# Patient Record
Sex: Male | Born: 1950 | Race: Black or African American | Hispanic: No | Marital: Married | Smoking: Current some day smoker
Health system: Southern US, Community
[De-identification: ages and names within clinical notes are randomized; demographics above are authoritative.]

## PROBLEM LIST (undated history)

## (undated) DIAGNOSIS — C61 Malignant neoplasm of prostate: Secondary | ICD-10-CM

## (undated) DIAGNOSIS — N200 Calculus of kidney: Secondary | ICD-10-CM

## (undated) DIAGNOSIS — I10 Essential (primary) hypertension: Secondary | ICD-10-CM

## (undated) DIAGNOSIS — M1990 Unspecified osteoarthritis, unspecified site: Secondary | ICD-10-CM

## (undated) HISTORY — DX: Calculus of kidney: N20.0

## (undated) HISTORY — PX: OTHER SURGICAL HISTORY: SHX169

## (undated) HISTORY — DX: Essential (primary) hypertension: I10

---

## 2008-07-21 ENCOUNTER — Emergency Department: Payer: Self-pay | Admitting: Emergency Medicine

## 2010-08-21 ENCOUNTER — Emergency Department: Payer: Self-pay | Admitting: Emergency Medicine

## 2013-12-23 ENCOUNTER — Emergency Department: Payer: Self-pay | Admitting: Emergency Medicine

## 2013-12-23 LAB — CBC
HCT: 47.6 % (ref 40.0–52.0)
HGB: 16.1 g/dL (ref 13.0–18.0)
MCH: 31.2 pg (ref 26.0–34.0)
MCHC: 33.9 g/dL (ref 32.0–36.0)
MCV: 92 fL (ref 80–100)
Platelet: 156 10*3/uL (ref 150–440)
RBC: 5.17 10*6/uL (ref 4.40–5.90)
RDW: 13.6 % (ref 11.5–14.5)
WBC: 7.2 10*3/uL (ref 3.8–10.6)

## 2013-12-23 LAB — BASIC METABOLIC PANEL
Anion Gap: 7 (ref 7–16)
BUN: 13 mg/dL (ref 7–18)
Calcium, Total: 8.5 mg/dL (ref 8.5–10.1)
Chloride: 108 mmol/L — ABNORMAL HIGH (ref 98–107)
Co2: 23 mmol/L (ref 21–32)
Creatinine: 1 mg/dL (ref 0.60–1.30)
EGFR (African American): >60
EGFR (Non-African Amer.): >60
Glucose: 87 mg/dL (ref 65–99)
Osmolality: 275 (ref 275–301)
Potassium: 4.1 mmol/L (ref 3.5–5.1)
Sodium: 138 mmol/L (ref 136–145)

## 2013-12-23 LAB — TROPONIN I
Troponin-I: 0.03 ng/mL
Troponin-I: 0.03 ng/mL

## 2016-02-29 ENCOUNTER — Emergency Department
Admission: EM | Admit: 2016-02-29 | Discharge: 2016-02-29 | Disposition: A | Discharge status: Home / Self Care | Payer: Commercial Managed Care - HMO | Attending: Emergency Medicine | Admitting: Emergency Medicine

## 2016-02-29 ENCOUNTER — Encounter: Payer: Self-pay | Admitting: Emergency Medicine

## 2016-02-29 DIAGNOSIS — F1290 Cannabis use, unspecified, uncomplicated: Secondary | ICD-10-CM | POA: Diagnosis not present

## 2016-02-29 DIAGNOSIS — J029 Acute pharyngitis, unspecified: Secondary | ICD-10-CM | POA: Diagnosis not present

## 2016-02-29 DIAGNOSIS — R0982 Postnasal drip: Secondary | ICD-10-CM

## 2016-02-29 DIAGNOSIS — F17200 Nicotine dependence, unspecified, uncomplicated: Secondary | ICD-10-CM | POA: Diagnosis not present

## 2016-02-29 DIAGNOSIS — M1990 Unspecified osteoarthritis, unspecified site: Secondary | ICD-10-CM | POA: Insufficient documentation

## 2016-02-29 HISTORY — DX: Unspecified osteoarthritis, unspecified site: M19.90

## 2016-02-29 MED ORDER — MAGIC MOUTHWASH W/LIDOCAINE: 5.0000 mL | Freq: Four times a day (QID) | ORAL | Status: DC | PRN

## 2016-02-29 MED ORDER — LORATADINE 10 MG PO TABS: 10.0000 mg | ORAL_TABLET | Freq: Every day | ORAL | Status: DC

## 2016-02-29 NOTE — Discharge Instructions (Signed)
Sore Throat A sore throat is pain, burning, irritation, or scratchiness of the throat. There is often pain or tenderness when swallowing or talking. A sore throat may be accompanied by other symptoms, such as coughing, sneezing, fever, and swollen neck glands. A sore throat is often the first sign of another sickness, such as a cold, flu, strep throat, or mononucleosis (commonly known as mono). Most sore throats go away without medical treatment. CAUSES  The most common causes of a sore throat include:  A viral infection, such as a cold, flu, or mono.  A bacterial infection, such as strep throat, tonsillitis, or whooping cough.  Seasonal allergies.  Dryness in the air.  Irritants, such as smoke or pollution.  Gastroesophageal reflux disease (GERD). HOME CARE INSTRUCTIONS   Only take over-the-counter medicines as directed by your caregiver.  Drink enough fluids to keep your urine clear or pale yellow.  Rest as needed.  Try using throat sprays, lozenges, or sucking on hard candy to ease any pain (if older than 4 years or as directed).  Sip warm liquids, such as broth, herbal tea, or warm water with honey to relieve pain temporarily. You may also eat or drink cold or frozen liquids such as frozen ice pops.  Gargle with salt water (mix 1 tsp salt with 8 oz of water).  Do not smoke and avoid secondhand smoke.  Put a cool-mist humidifier in your bedroom at night to moisten the air. You can also turn on a hot shower and sit in the bathroom with the door closed for 5-10 minutes. SEEK IMMEDIATE MEDICAL CARE IF:  You have difficulty breathing.  You are unable to swallow fluids, soft foods, or your saliva.  You have increased swelling in the throat.  Your sore throat does not get better in 7 days.  You have nausea and vomiting.  You have a fever or persistent symptoms for more than 2-3 days.  You have a fever and your symptoms suddenly get worse. MAKE SURE YOU:   Understand  these instructions.  Will watch your condition.  Will get help right away if you are not doing well or get worse.   This information is not intended to replace advice given to you by your health care provider. Make sure you discuss any questions you have with your health care provider.   Document Released: 10/25/2004 Document Revised: 10/08/2014 Document Reviewed: 05/25/2012 Elsevier Interactive Patient Education 2016 [employer]  Viral Infections A viral infection can be caused by different types of viruses.Most viral infections are not serious and resolve on their own. However, some infections may cause severe symptoms and may lead to further complications. SYMPTOMS Viruses can frequently cause:  Minor sore throat.  Aches and pains.  Headaches.  Runny nose.  Different types of rashes.  Watery eyes.  Tiredness.  Cough.  Loss of appetite.  Gastrointestinal infections, resulting in nausea, vomiting, and diarrhea. These symptoms do not respond to antibiotics because the infection is not caused by bacteria. However, you might catch a bacterial infection following the viral infection. This is sometimes called a "superinfection." Symptoms of such a bacterial infection may include:  Worsening sore throat with pus and difficulty swallowing.  Swollen neck glands.  Chills and a high or persistent fever.  Severe headache.  Tenderness over the sinuses.  Persistent overall ill feeling (malaise), muscle aches, and tiredness (fatigue).  Persistent cough.  Yellow, green, or brown mucus production with coughing. HOME CARE INSTRUCTIONS   Only take over-the-counter or  prescription medicines for pain, discomfort, diarrhea, or fever as directed by your caregiver.  Drink enough water and fluids to keep your urine clear or pale yellow. Sports drinks can provide valuable electrolytes, sugars, and hydration.  Get plenty of rest and maintain proper nutrition. Soups and broths  with crackers or rice are fine. SEEK IMMEDIATE MEDICAL CARE IF:   You have severe headaches, shortness of breath, chest pain, neck pain, or an unusual rash.  You have uncontrolled vomiting, diarrhea, or you are unable to keep down fluids.  You or your child has an oral temperature above 102 F (38.9 C), not controlled by medicine.  Your baby is older than 3 months with a rectal temperature of 102 F (38.9 C) or higher.  Your baby is 67 months old or younger with a rectal temperature of 100.4 F (38 C) or higher. MAKE SURE YOU:   Understand these instructions.  Will watch your condition.  Will get help right away if you are not doing well or get worse.   This information is not intended to replace advice given to you by your health care provider. Make sure you discuss any questions you have with your health care provider.   Document Released: 06/27/2005 Document Revised: 12/10/2011 Document Reviewed: 02/23/2015 Elsevier Interactive Patient Education [employer].

## 2016-02-29 NOTE — ED Provider Notes (Signed)
[facility] Emergency Department Provider Note  ____________________________________________  Time seen: Approximately 7:55 AM  I have reviewed the triage vital signs and the nursing notes.   HISTORY  Chief Complaint Sore Throat    HPI Alexander Cooke is a 65 y.o. male , NAD, presents to the emergency department with one-day history of right-sided sore throat. Denies any injury or trauma to the face, neck, throat. States it hurts to swallow. Took an anti-inflammatory last night which seems to help some but did not take anything today. Denies any sick contacts. Has had mild nasal congestion but nothing that he has thought has been significant. Denies any sinus pressure, ear pain, headaches, nausea, vomiting, abdominal pain, rash.  Denies any fevers, chills, body aches. No difficulty swallowing nor difficulty breathing.   Past Medical History  Diagnosis Date  . Arthritis     There are no active problems to display for this patient.   History reviewed. No pertinent past surgical history.  Current Outpatient Rx  Name  Route  Sig  Dispense  Refill  . loratadine (CLARITIN) 10 MG tablet   Oral   Take 1 tablet (10 mg total) by mouth daily.   30 tablet   0   . magic mouthwash w/lidocaine SOLN   Oral   Take 5 mLs by mouth 4 (four) times daily as needed for mouth pain.   240 mL   0     Please mix 63mL diphenhydramine, 71mL nystatin, 80 ...     Allergies Review of patient's allergies indicates no known allergies.  No family history on file.  Social History Social History  Substance Use Topics  . Smoking status: Current Every Day Smoker  . Smokeless tobacco: None  . Alcohol Use: Yes     Review of Systems  Constitutional: No fever/chills, fatigue Eyes: No visual changes. No discharge, redness, swelling ENT: Positive sore throat, nasal congestion. No sinus pressure, ear pain, sneezing, runny nose. Cardiovascular: No chest pain. Respiratory: No  cough, chest congestion. No shortness of breath. No wheezing.  Gastrointestinal: No abdominal pain.  No nausea, vomiting.  No diarrhea.  Musculoskeletal: Negative for neck pain.  Skin: Negative for rash. Neurological: Negative for headaches, focal weakness or numbness. 10-point ROS otherwise negative.  ____________________________________________   PHYSICAL EXAM:  VITAL SIGNS: ED Triage Vitals  Enc Vitals Group     BP 02/29/16 0733 135/74 mmHg     Pulse Rate 02/29/16 0733 93     Resp 02/29/16 0733 18     Temp 02/29/16 0733 99.8 F (37.7 C)     Temp src --      SpO2 02/29/16 0733 94 %     Weight 02/29/16 0733 216 lb (97.977 kg)     Height 02/29/16 0733 5\' 7"  (1.702 m)     Head Cir --      Peak Flow --      Pain Score 02/29/16 0734 9     Pain Loc --      Pain Edu? --      Excl. in [place]? --      Constitutional: Alert and oriented. Well appearing and in no acute distress. Eyes: Conjunctivae are normal. PERRL. EOMI without pain.  Head: Atraumatic. ENT:      Ears: TMs visualized bilaterally without erythema, effusion, bulging, perforation.      Nose: No congestion/rhinnorhea.      Mouth/Throat: Mild injection of the pharynx without exudate or swelling. Uvula is midline. Mucous membranes are moist.  Clear postnasal drip noted. Neck: No stridor. No cervical spine tenderness to palpation. Supple with full range of motion. Hematological/Lymphatic/Immunilogical: No cervical lymphadenopathy but tenderness to palpation about the right, anterior cervical chain. Cardiovascular: Normal rate, regular rhythm. Normal S1 and S2.  Good peripheral circulation. Respiratory: Normal respiratory effort without tachypnea or retractions. Lungs CTAB with breath sounds noted in all lung fields. Neurologic:  Normal speech and language. No gross focal neurologic deficits are appreciated.  Skin:  Skin is warm, dry and intact. No rash noted. Psychiatric: Mood and affect are normal. Speech and behavior are  normal. Patient exhibits appropriate insight and judgement.   ____________________________________________   LABS (all labs ordered are listed, but only abnormal results are displayed)  Labs Reviewed  CULTURE, GROUP A STREP [facility])   ____________________________________________  EKG  None ____________________________________________  RADIOLOGY  None ____________________________________________    PROCEDURES  Procedure(s) performed: None    Medications - No data to display   ____________________________________________   INITIAL IMPRESSION / ASSESSMENT AND PLAN / ED COURSE  Pertinent lab results that were available during my care of the patient were reviewed by me and considered in my medical decision making (see chart for details). Group A strep culture was ordered and results will be called to the patient when available.  Patient's diagnosis is consistent with acute pharyngitis with post nasal drainage. Discussed with patient that symptoms are consistent with viral infection. Discussed we are completing throat culture will call him with results when available and treated further as necessary. Patient will be discharged home with prescriptions for loratadine and Magic mouthwash with lidocaine to use as directed. Advised patient to alternate Tylenol and ibuprofen as needed for throat pain. Patient is to follow up with [facility] if symptoms persist past this treatment course. Patient is given strict ED precautions to return to the ED for any worsening or new symptoms.     ____________________________________________  FINAL CLINICAL IMPRESSION(S) / ED DIAGNOSES  Final diagnoses:  Acute pharyngitis, unspecified etiology  Post-nasal drainage      NEW MEDICATIONS STARTED DURING THIS VISIT:  Discharge Medication List as of 02/29/2016  8:16 AM    START taking these medications   Details  loratadine (CLARITIN) 10 MG tablet Take 1 tablet (10 mg total) by  mouth daily., Starting 02/29/2016, Until Discontinued, Print    magic mouthwash w/lidocaine SOLN Take 5 mLs by mouth 4 (four) times daily as needed for mouth pain., Starting 02/29/2016, Until Discontinued, Print             Judithe Modest Larke, PA-C 02/29/16 [id number]  Delman Kitten, MD 02/29/16 [phone]

## 2016-02-29 NOTE — ED Notes (Signed)
Pt presents to ED with reports of sore throat mainly on the right side. Pt states has had a little nasal congestion. Pt speaking in complete sentences and in no apparent distress in triage.

## 2016-03-02 LAB — CULTURE, GROUP A STREP (THRC)

## 2016-03-23 DIAGNOSIS — Z125 Encounter for screening for malignant neoplasm of prostate: Secondary | ICD-10-CM | POA: Diagnosis not present

## 2016-03-23 DIAGNOSIS — G5603 Carpal tunnel syndrome, bilateral upper limbs: Secondary | ICD-10-CM | POA: Diagnosis not present

## 2016-03-23 DIAGNOSIS — I10 Essential (primary) hypertension: Secondary | ICD-10-CM | POA: Diagnosis not present

## 2016-03-23 DIAGNOSIS — Z0001 Encounter for general adult medical examination with abnormal findings: Secondary | ICD-10-CM | POA: Diagnosis not present

## 2016-03-23 DIAGNOSIS — R030 Elevated blood-pressure reading, without diagnosis of hypertension: Secondary | ICD-10-CM | POA: Diagnosis not present

## 2016-03-23 DIAGNOSIS — F17210 Nicotine dependence, cigarettes, uncomplicated: Secondary | ICD-10-CM | POA: Diagnosis not present

## 2016-04-23 DIAGNOSIS — E781 Pure hyperglyceridemia: Secondary | ICD-10-CM | POA: Diagnosis not present

## 2016-04-23 DIAGNOSIS — G5603 Carpal tunnel syndrome, bilateral upper limbs: Secondary | ICD-10-CM | POA: Diagnosis not present

## 2016-04-23 DIAGNOSIS — Z125 Encounter for screening for malignant neoplasm of prostate: Secondary | ICD-10-CM | POA: Diagnosis not present

## 2016-04-23 DIAGNOSIS — F17210 Nicotine dependence, cigarettes, uncomplicated: Secondary | ICD-10-CM | POA: Diagnosis not present

## 2016-04-23 DIAGNOSIS — S39012A Strain of muscle, fascia and tendon of lower back, initial encounter: Secondary | ICD-10-CM | POA: Diagnosis not present

## 2016-04-23 DIAGNOSIS — I10 Essential (primary) hypertension: Secondary | ICD-10-CM | POA: Diagnosis not present

## 2016-04-23 DIAGNOSIS — Z0001 Encounter for general adult medical examination with abnormal findings: Secondary | ICD-10-CM | POA: Diagnosis not present

## 2016-07-27 DIAGNOSIS — S81811A Laceration without foreign body, right lower leg, initial encounter: Secondary | ICD-10-CM | POA: Diagnosis not present

## 2017-01-31 ENCOUNTER — Other Ambulatory Visit: Payer: Self-pay | Admitting: Nurse Practitioner

## 2017-01-31 ENCOUNTER — Ambulatory Visit
Admission: RE | Admit: 2017-01-31 | Discharge: 2017-01-31 | Disposition: A | Discharge status: Home / Self Care | Payer: Medicare HMO | Source: Ambulatory Visit | Attending: Nurse Practitioner | Admitting: Nurse Practitioner

## 2017-01-31 DIAGNOSIS — M064 Inflammatory polyarthropathy: Secondary | ICD-10-CM | POA: Diagnosis not present

## 2017-01-31 DIAGNOSIS — M1712 Unilateral primary osteoarthritis, left knee: Secondary | ICD-10-CM | POA: Diagnosis not present

## 2017-01-31 DIAGNOSIS — R52 Pain, unspecified: Secondary | ICD-10-CM

## 2017-01-31 DIAGNOSIS — M25462 Effusion, left knee: Secondary | ICD-10-CM | POA: Diagnosis not present

## 2017-01-31 DIAGNOSIS — F17210 Nicotine dependence, cigarettes, uncomplicated: Secondary | ICD-10-CM | POA: Diagnosis not present

## 2017-01-31 DIAGNOSIS — M189 Osteoarthritis of first carpometacarpal joint, unspecified: Secondary | ICD-10-CM | POA: Diagnosis not present

## 2017-01-31 DIAGNOSIS — M25562 Pain in left knee: Secondary | ICD-10-CM | POA: Diagnosis not present

## 2017-01-31 DIAGNOSIS — I10 Essential (primary) hypertension: Secondary | ICD-10-CM | POA: Diagnosis not present

## 2017-02-13 DIAGNOSIS — M238X2 Other internal derangements of left knee: Secondary | ICD-10-CM | POA: Diagnosis not present

## 2017-06-18 DIAGNOSIS — M109 Gout, unspecified: Secondary | ICD-10-CM | POA: Diagnosis not present

## 2017-06-18 DIAGNOSIS — E781 Pure hyperglyceridemia: Secondary | ICD-10-CM | POA: Diagnosis not present

## 2017-06-18 DIAGNOSIS — I10 Essential (primary) hypertension: Secondary | ICD-10-CM | POA: Diagnosis not present

## 2017-06-18 DIAGNOSIS — G5603 Carpal tunnel syndrome, bilateral upper limbs: Secondary | ICD-10-CM | POA: Diagnosis not present

## 2017-06-18 DIAGNOSIS — Z0001 Encounter for general adult medical examination with abnormal findings: Secondary | ICD-10-CM | POA: Diagnosis not present

## 2017-06-18 DIAGNOSIS — F17210 Nicotine dependence, cigarettes, uncomplicated: Secondary | ICD-10-CM | POA: Diagnosis not present

## 2017-06-20 DIAGNOSIS — E538 Deficiency of other specified B group vitamins: Secondary | ICD-10-CM | POA: Diagnosis not present

## 2017-06-20 DIAGNOSIS — E079 Disorder of thyroid, unspecified: Secondary | ICD-10-CM | POA: Diagnosis not present

## 2017-06-20 DIAGNOSIS — Z125 Encounter for screening for malignant neoplasm of prostate: Secondary | ICD-10-CM | POA: Diagnosis not present

## 2017-06-20 DIAGNOSIS — Z0001 Encounter for general adult medical examination with abnormal findings: Secondary | ICD-10-CM | POA: Diagnosis not present

## 2017-06-20 DIAGNOSIS — E0781 Sick-euthyroid syndrome: Secondary | ICD-10-CM | POA: Diagnosis not present

## 2017-06-20 DIAGNOSIS — R972 Elevated prostate specific antigen [PSA]: Secondary | ICD-10-CM | POA: Diagnosis not present

## 2017-06-20 DIAGNOSIS — I10 Essential (primary) hypertension: Secondary | ICD-10-CM | POA: Diagnosis not present

## 2017-06-20 DIAGNOSIS — E781 Pure hyperglyceridemia: Secondary | ICD-10-CM | POA: Diagnosis not present

## 2017-07-18 DIAGNOSIS — R944 Abnormal results of kidney function studies: Secondary | ICD-10-CM | POA: Diagnosis not present

## 2017-07-18 DIAGNOSIS — R972 Elevated prostate specific antigen [PSA]: Secondary | ICD-10-CM | POA: Diagnosis not present

## 2017-07-18 DIAGNOSIS — I10 Essential (primary) hypertension: Secondary | ICD-10-CM | POA: Diagnosis not present

## 2017-07-22 DIAGNOSIS — R944 Abnormal results of kidney function studies: Secondary | ICD-10-CM | POA: Diagnosis not present

## 2017-08-08 ENCOUNTER — Other Ambulatory Visit: Payer: Self-pay | Admitting: Internal Medicine

## 2017-08-08 DIAGNOSIS — R944 Abnormal results of kidney function studies: Secondary | ICD-10-CM

## 2017-08-14 ENCOUNTER — Ambulatory Visit: Payer: Medicare HMO

## 2017-08-15 ENCOUNTER — Ambulatory Visit: Payer: Medicare HMO

## 2018-02-26 ENCOUNTER — Other Ambulatory Visit: Payer: Self-pay

## 2018-04-01 IMAGING — CR DG KNEE COMPLETE 4+V*L*
1 series · 4 of 4 positions shown · non-contrast
Comparison: None.

CLINICAL DATA: Knee pain.

EXAM:
LEFT KNEE - COMPLETE 4+ VIEW

[Series 1: dg knee 4 v w/ sunrise/patella left · 0.14mm/px · 4 of 4 slices shown]
[im 1/4]
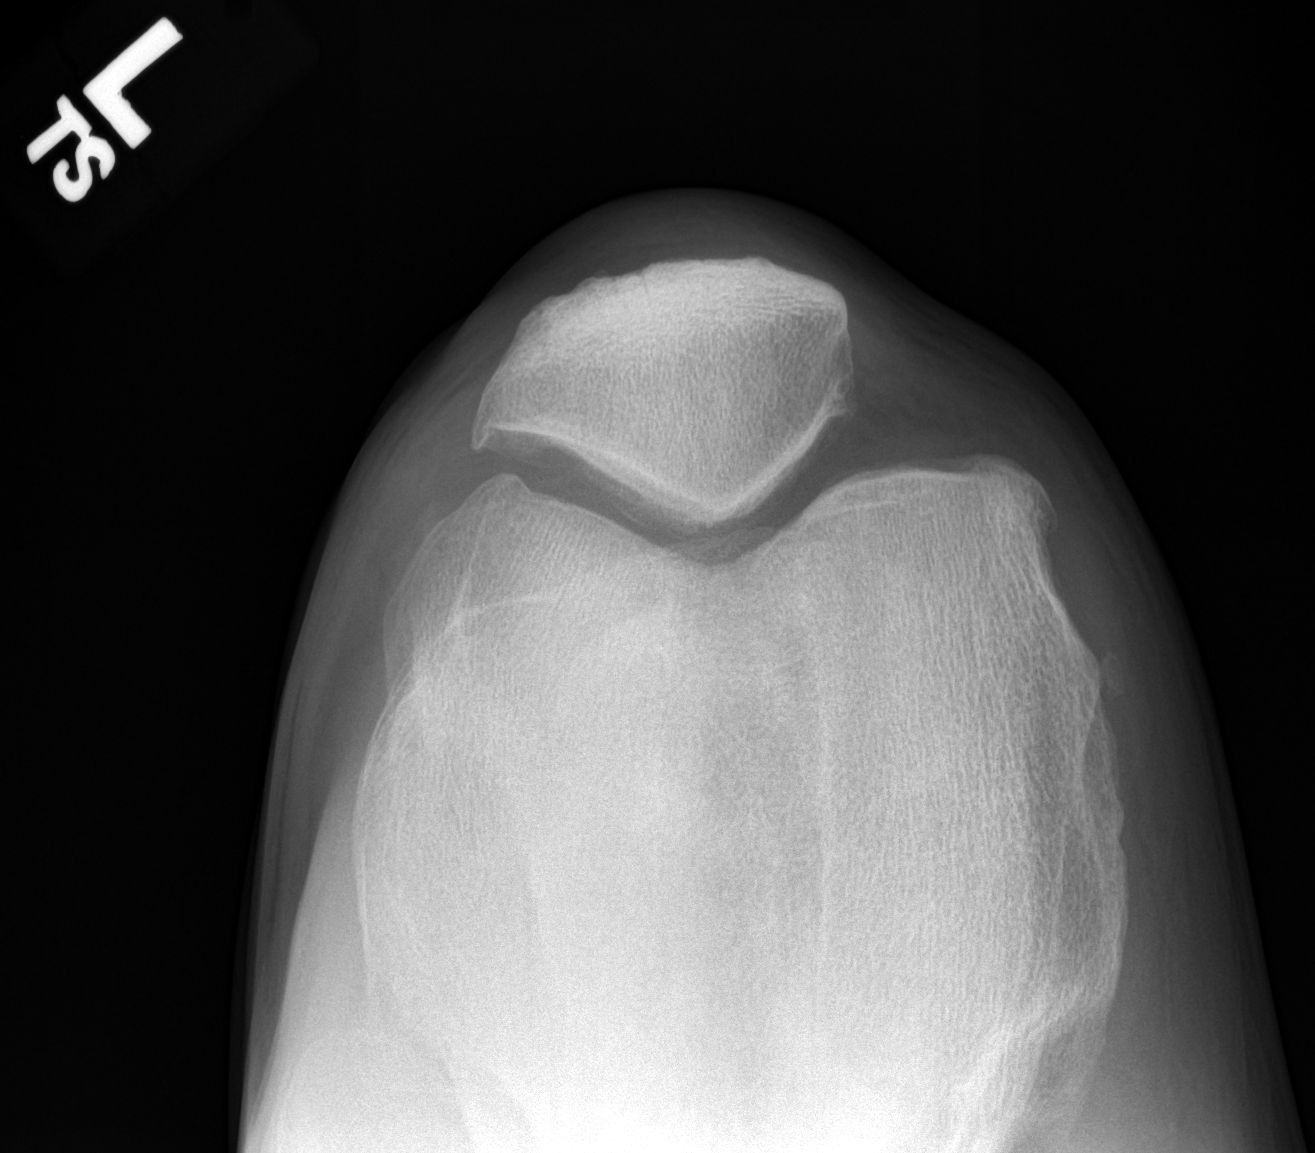
[im 2/4]
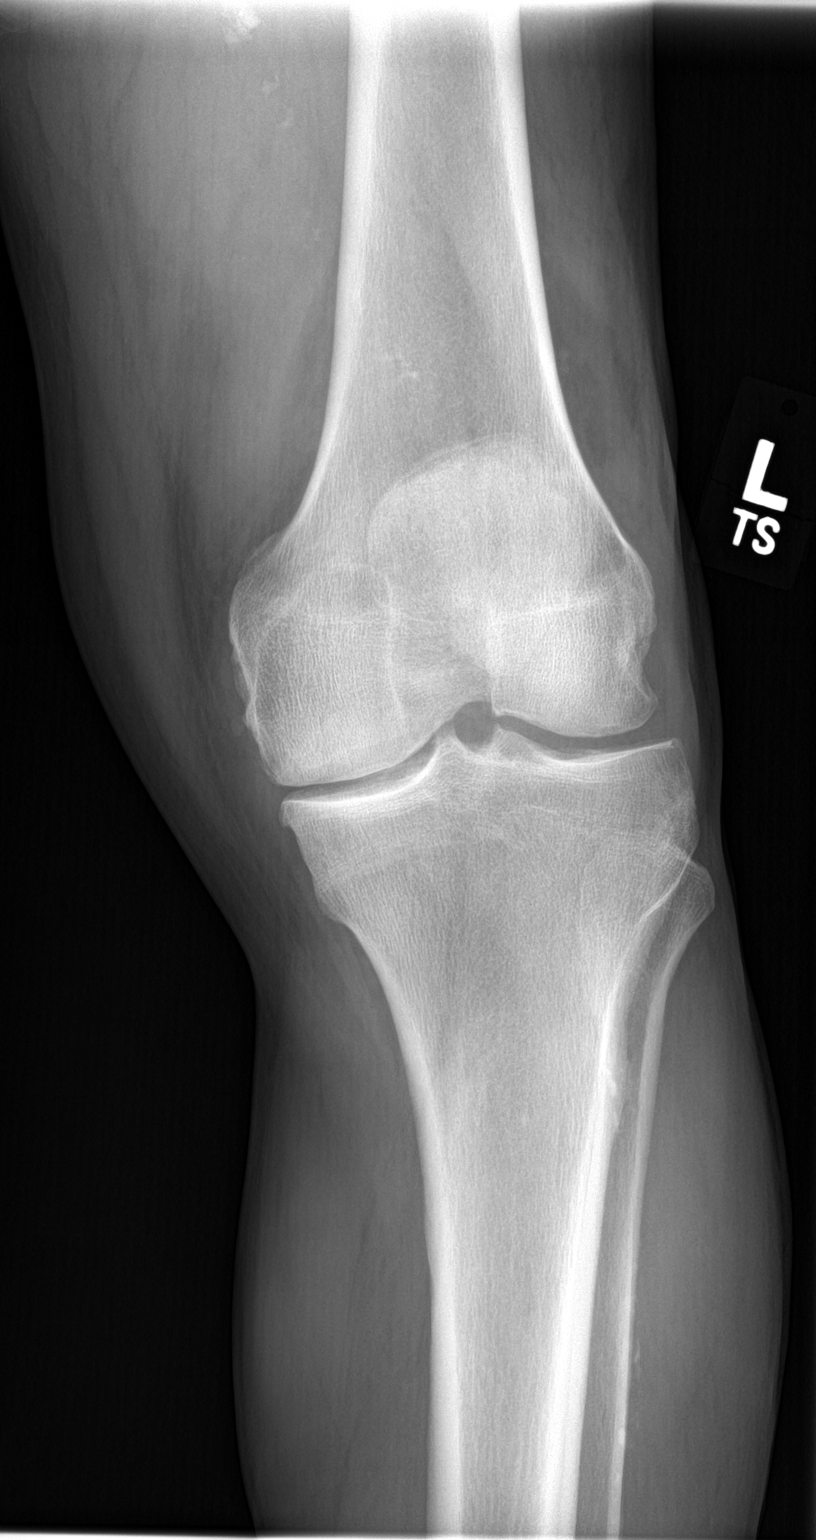
[im 3/4]
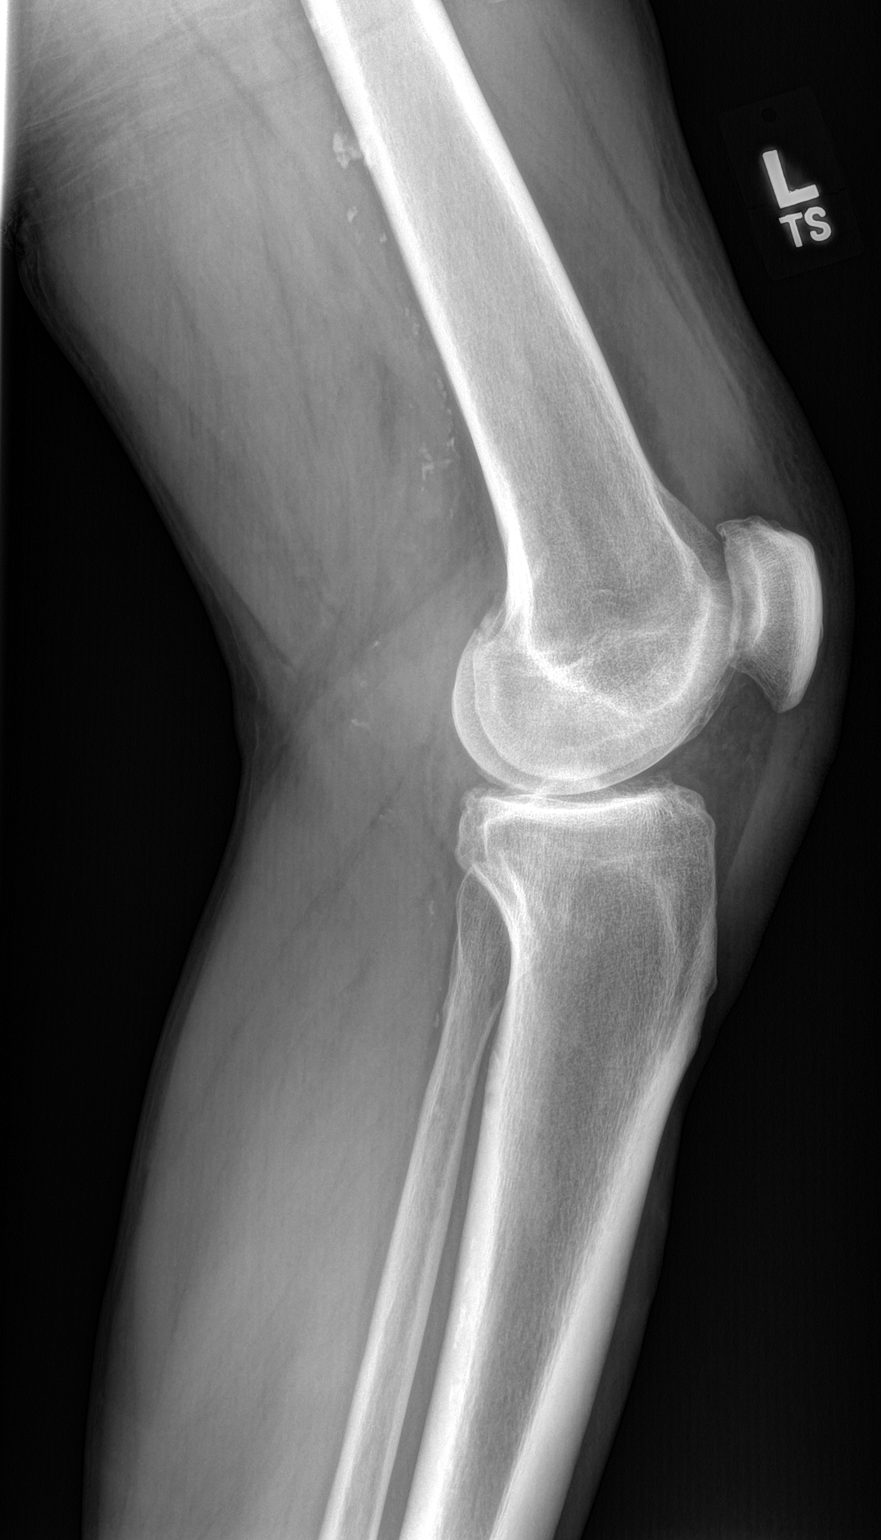
[im 4/4]
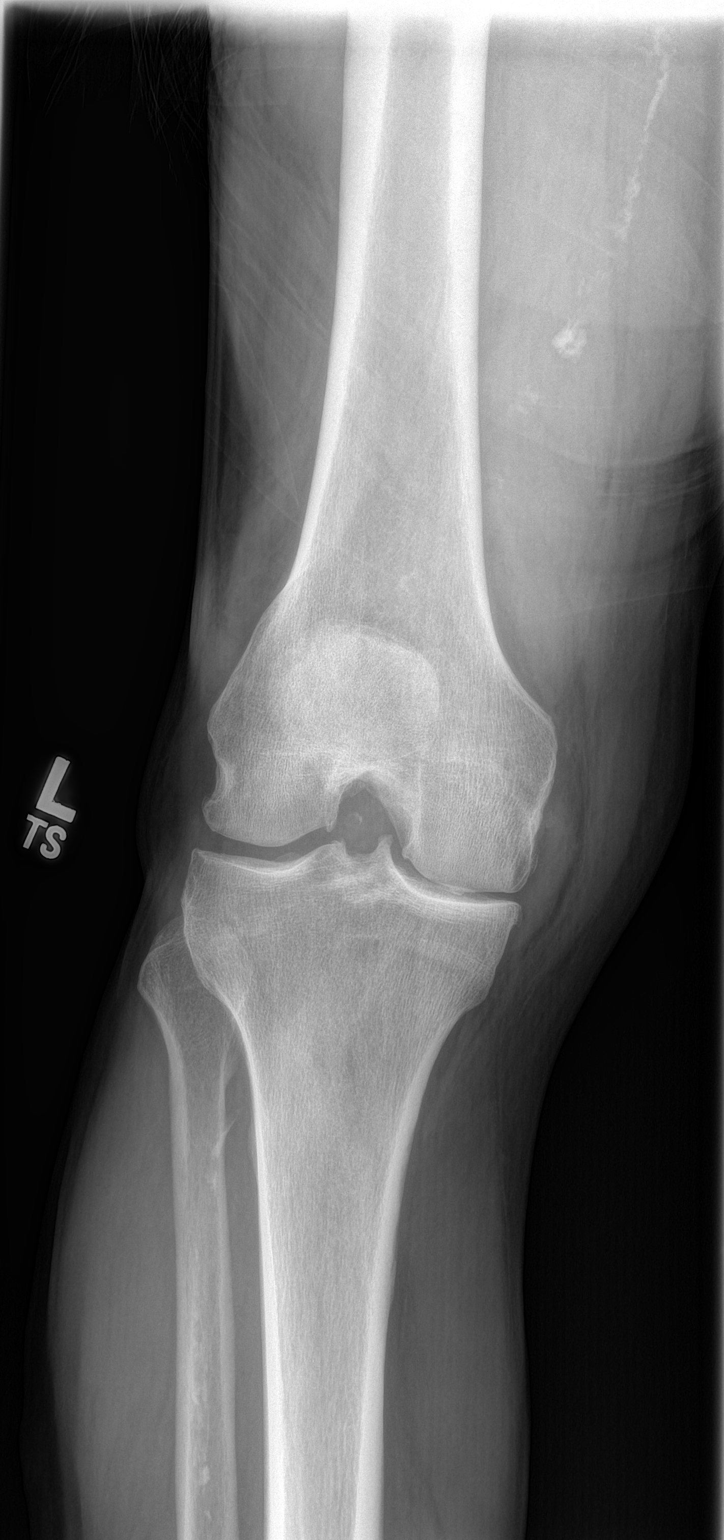

[4 of 4 positions shown; findings below may reference images not displayed]

FINDINGS: Mild degenerative changes with joint space narrowing and spurring,
most pronounced in the medial and patellofemoral compartments. Small
joint effusion. No acute bony abnormality. Specifically, no
fracture, subluxation, or dislocation. Soft tissues are intact.
IMPRESSION: Mild tricompartment degenerative changes. Small joint effusion. No
acute bony abnormality. Specifically, no fracture, subluxation, or
dislocation. Soft tissues are intact.

## 2018-04-21 ENCOUNTER — Other Ambulatory Visit: Payer: Self-pay | Admitting: Nurse Practitioner

## 2018-04-21 ENCOUNTER — Encounter: Payer: Self-pay | Admitting: Adult Health

## 2018-04-21 ENCOUNTER — Ambulatory Visit (INDEPENDENT_AMBULATORY_CARE_PROVIDER_SITE_OTHER): Payer: Medicare HMO | Admitting: Adult Health

## 2018-04-21 VITALS — BP 140/92 | HR 71 | Resp 16 | Ht 68.0 in | Wt 206.0 lb

## 2018-04-21 DIAGNOSIS — R300 Dysuria: Secondary | ICD-10-CM

## 2018-04-21 DIAGNOSIS — M85641 Other cyst of bone, right hand: Secondary | ICD-10-CM

## 2018-04-21 DIAGNOSIS — Z9114 Patient's other noncompliance with medication regimen: Secondary | ICD-10-CM | POA: Diagnosis not present

## 2018-04-21 DIAGNOSIS — M1A9XX0 Chronic gout, unspecified, without tophus (tophi): Secondary | ICD-10-CM | POA: Diagnosis not present

## 2018-04-21 DIAGNOSIS — I10 Essential (primary) hypertension: Secondary | ICD-10-CM

## 2018-04-21 DIAGNOSIS — J302 Other seasonal allergic rhinitis: Secondary | ICD-10-CM

## 2018-04-21 MED ORDER — AMLODIPINE BESYLATE 5 MG PO TABS: 5.0000 mg | ORAL_TABLET | Freq: Every day | ORAL | 3 refills | Status: DC

## 2018-04-21 NOTE — Progress Notes (Signed)
[facility] [place], [place] [address]  Internal MEDICINE  Office Visit Note  Patient Name: Alexander Cooke  [id number]  [id number]  Date of Service: 04/25/2018  Chief Complaint  Patient presents with  . Hypertension    HPI  Pt here for follow up.  He reports he is not taking his blood pressure medicine because he does not think he needs it.  He reports swimming, and stretching at the gym approx twice per week. He is complaining of a cyst on the dorsum of his right hand.    Current Medication: Outpatient Encounter Medications as of 04/21/2018  Medication Sig  . hydrochlorothiazide (HYDRODIURIL) 25 MG tablet Take by mouth.  . indomethacin (INDOCIN) 50 MG capsule take 1 capsule by mouth once daily  . loratadine (CLARITIN) 10 MG tablet Take 1 tablet (10 mg total) by mouth daily. (Patient not taking: Reported on 04/21/2018)  . loratadine (CLARITIN) 10 MG tablet Take by mouth.  . magic mouthwash w/lidocaine SOLN Take 5 mLs by mouth 4 (four) times daily as needed for mouth pain. (Patient not taking: Reported on 04/21/2018)  . [DISCONTINUED] amLODipine (NORVASC) 5 MG tablet Take 1 tablet (5 mg total) by mouth daily.   No facility-administered encounter medications on file as of 04/21/2018.     Surgical History: Past Surgical History:  Procedure Laterality Date  . none      Medical History: Past Medical History:  Diagnosis Date  . Arthritis     Family History: Family History  Family history unknown: Yes    Social History   Socioeconomic History  . Marital status: Married    Spouse name: Not on file  . Number of children: Not on file  . Years of education: Not on file  . Highest education level: Not on file  Occupational History  . Not on file  Social Needs  . Financial resource strain: Not on file  . Food insecurity:    Worry: Not on file    Inability: Not on file  . Transportation needs:    Medical: Not on file    Non-medical: Not on  file  Tobacco Use  . Smoking status: Current Every Day Smoker  . Smokeless tobacco: Never Used  Substance and Sexual Activity  . Alcohol use: Yes  . Drug use: Yes    Types: Marijuana  . Sexual activity: Not on file  Lifestyle  . Physical activity:    Days per week: Not on file    Minutes per session: Not on file  . Stress: Not on file  Relationships  . Social connections:    Talks on phone: Not on file    Gets together: Not on file    Attends religious service: Not on file    Active member of club or organization: Not on file    Attends meetings of clubs or organizations: Not on file    Relationship status: Not on file  . Intimate partner violence:    Fear of current or ex partner: Not on file    Emotionally abused: Not on file    Physically abused: Not on file    Forced sexual activity: Not on file  Other Topics Concern  . Not on file  Social History Narrative  . Not on file      Review of Systems  Constitutional: Negative.  Negative for chills, fatigue and unexpected weight change.  HENT: Negative.  Negative for congestion, rhinorrhea, sneezing and sore throat.   Eyes:  Negative for redness.  Respiratory: Negative.  Negative for cough, chest tightness and shortness of breath.   Cardiovascular: Negative.  Negative for chest pain and palpitations.  Gastrointestinal: Negative.  Negative for abdominal pain, constipation, diarrhea, nausea and vomiting.  Endocrine: Negative.   Genitourinary: Negative.  Negative for dysuria and frequency.  Musculoskeletal: Negative.  Negative for arthralgias, back pain, joint swelling and neck pain.  Skin: Negative.  Negative for rash.  Allergic/Immunologic: Negative.   Neurological: Negative.  Negative for tremors and numbness.  Hematological: Negative for adenopathy. Does not bruise/bleed easily.  Psychiatric/Behavioral: Negative.  Negative for behavioral problems, sleep disturbance and suicidal ideas. The patient is not nervous/anxious.      Vital Signs: BP (!) 140/92   Pulse 71   Resp 16   Ht 5\' 8"  (1.727 m)   Wt 206 lb (93.4 kg)   SpO2 96%   BMI 31.32 kg/m    Physical Exam  Constitutional: He is oriented to person, place, and time. He appears well-developed and well-nourished. No distress.  HENT:  Head: Normocephalic and atraumatic.  Mouth/Throat: Oropharynx is clear and moist. No oropharyngeal exudate.  Eyes: Pupils are equal, round, and reactive to light. EOM are normal.  Neck: Normal range of motion. Neck supple. No JVD present. No tracheal deviation present. No thyromegaly present.  Cardiovascular: Normal rate, regular rhythm and normal heart sounds. Exam reveals no gallop and no friction rub.  No murmur heard. Pulmonary/Chest: Effort normal and breath sounds normal. No respiratory distress. He has no wheezes. He has no rales. He exhibits no tenderness.  Abdominal: Soft. There is no tenderness. There is no guarding.  Musculoskeletal: Normal range of motion.  Lymphadenopathy:    He has no cervical adenopathy.  Neurological: He is alert and oriented to person, place, and time. No cranial nerve deficit.  Skin: Skin is warm and dry. He is not diaphoretic.  Psychiatric: He has a normal mood and affect. His behavior is normal. Judgment and thought content normal.  Nursing note and vitals reviewed.   Assessment/Plan: 1. Hypertension, unspecified type Non-compliant with current medication.  Discontinue HCTZ, Start taking Amolodipine 5mg  po daily.   2. Seasonal allergies Continue taking Claritin.   3. Non compliance w medication regimen Encouraged patient to take medications as directed.  Discussed long term side effects of uncontrolled htn.    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  5. Cyst of bone of right hand - Ambulatory referral to Orthopedic Surgery  6. Dysuria - UA/M w/rflx Culture, Routine  General Counseling:  [name] verbalizes understanding of the findings of todays visit and agrees with plan of treatment. I have discussed any further diagnostic evaluation that may be needed or ordered today. We also reviewed his medications today. he has been encouraged to call the office with any questions or concerns that should arise related to todays visit.    Orders Placed This Encounter  Procedures  . Microscopic Examination  . UA/M w/rflx Culture, Routine  . Specimen status report  . Ambulatory referral to Orthopedic Surgery    Meds ordered this encounter  Medications  . DISCONTD: amLODipine (NORVASC) 5 MG tablet    Sig: Take 1 tablet (5 mg total) by mouth daily.    Dispense:  90 tablet    Refill:  3    Time spent: 25 Minutes   This patient was seen by Orson Gear AGNP-C in Collaboration with Dr Lavera Guise as a  part of collaborative care agreement    Dr Lavera Guise Internal medicine

## 2018-04-22 LAB — SPECIMEN STATUS REPORT

## 2018-04-24 DIAGNOSIS — R2231 Localized swelling, mass and lump, right upper limb: Secondary | ICD-10-CM | POA: Diagnosis not present

## 2018-04-24 DIAGNOSIS — G5603 Carpal tunnel syndrome, bilateral upper limbs: Secondary | ICD-10-CM | POA: Diagnosis not present

## 2018-05-02 LAB — MICROSCOPIC EXAMINATION
Casts: NONE SEEN /lpf
RBC, UA: NONE SEEN /hpf (ref 0–2)

## 2018-05-02 LAB — UA/M W/RFLX CULTURE, ROUTINE
Bilirubin, UA: NEGATIVE
Glucose, UA: NEGATIVE
Ketones, UA: NEGATIVE
Leukocytes, UA: NEGATIVE
Nitrite, UA: NEGATIVE
Protein, UA: NEGATIVE
RBC, UA: NEGATIVE
Specific Gravity, UA: 1.024 (ref 1.005–1.030)
Urobilinogen, Ur: 0.2 mg/dL (ref 0.2–1.0)
pH, UA: 5 (ref 5.0–7.5)

## 2018-05-08 DIAGNOSIS — G5621 Lesion of ulnar nerve, right upper limb: Secondary | ICD-10-CM | POA: Diagnosis not present

## 2018-05-08 DIAGNOSIS — G5603 Carpal tunnel syndrome, bilateral upper limbs: Secondary | ICD-10-CM | POA: Diagnosis not present

## 2018-05-15 DIAGNOSIS — R2231 Localized swelling, mass and lump, right upper limb: Secondary | ICD-10-CM | POA: Diagnosis not present

## 2018-05-15 DIAGNOSIS — L608 Other nail disorders: Secondary | ICD-10-CM | POA: Diagnosis not present

## 2018-05-15 DIAGNOSIS — G5601 Carpal tunnel syndrome, right upper limb: Secondary | ICD-10-CM | POA: Diagnosis not present

## 2018-05-15 DIAGNOSIS — G5621 Lesion of ulnar nerve, right upper limb: Secondary | ICD-10-CM | POA: Diagnosis not present

## 2018-06-23 ENCOUNTER — Ambulatory Visit (INDEPENDENT_AMBULATORY_CARE_PROVIDER_SITE_OTHER): Payer: Medicare HMO | Admitting: Adult Health

## 2018-06-23 ENCOUNTER — Encounter: Payer: Self-pay | Admitting: Adult Health

## 2018-06-23 VITALS — BP 148/90 | HR 78 | Resp 16 | Ht 68.0 in | Wt 207.8 lb

## 2018-06-23 DIAGNOSIS — M1A9XX0 Chronic gout, unspecified, without tophus (tophi): Secondary | ICD-10-CM | POA: Diagnosis not present

## 2018-06-23 DIAGNOSIS — Z1211 Encounter for screening for malignant neoplasm of colon: Secondary | ICD-10-CM | POA: Diagnosis not present

## 2018-06-23 DIAGNOSIS — F1010 Alcohol abuse, uncomplicated: Secondary | ICD-10-CM | POA: Diagnosis not present

## 2018-06-23 DIAGNOSIS — R300 Dysuria: Secondary | ICD-10-CM | POA: Diagnosis not present

## 2018-06-23 DIAGNOSIS — F17200 Nicotine dependence, unspecified, uncomplicated: Secondary | ICD-10-CM

## 2018-06-23 DIAGNOSIS — Z0001 Encounter for general adult medical examination with abnormal findings: Secondary | ICD-10-CM

## 2018-06-23 DIAGNOSIS — Z9114 Patient's other noncompliance with medication regimen: Secondary | ICD-10-CM

## 2018-06-23 DIAGNOSIS — Z125 Encounter for screening for malignant neoplasm of prostate: Secondary | ICD-10-CM | POA: Diagnosis not present

## 2018-06-23 DIAGNOSIS — I10 Essential (primary) hypertension: Secondary | ICD-10-CM | POA: Diagnosis not present

## 2018-06-23 NOTE — Progress Notes (Signed)
[facility], [place] [address]  Internal MEDICINE  Office Visit Note  Patient Name: Alexander Cooke  [id number]  [id number]  Date of Service: 06/26/2018  Chief Complaint  Patient presents with  . Annual Exam    medicare annual exam, pt does not know names of his medications   . Hypertension  . Quality Metric Gaps    colonoscopy needed    HPI Pt is here for routine health maintenance examination. He is a 67yo African [place] male, who appears well nourished. He was restarted on Amlodipine at last visit.  He reports he has been taking it everyday. His BP today remains elevated.  He reports he smoked a cigarette this morning before coming to the office, and he states he drank approximately 6 beers and a pint of liquor last night.  He denies other issues at this time.  He denies illicit drug use.  Pt is due for colonoscopy, however he refused to have it done at this time.   Current Medication: Outpatient Encounter Medications as of 06/23/2018  Medication Sig  . amLODipine (NORVASC) 5 MG tablet Take 1 tablet (5 mg total) by mouth daily.  . indomethacin (INDOCIN) 50 MG capsule take 1 capsule by mouth once daily  . loratadine (CLARITIN) 10 MG tablet Take 1 tablet (10 mg total) by mouth daily.  Marland Kitchen loratadine (CLARITIN) 10 MG tablet Take by mouth.  . [DISCONTINUED] hydrochlorothiazide (HYDRODIURIL) 25 MG tablet Take by mouth.  . magic mouthwash w/lidocaine SOLN Take 5 mLs by mouth 4 (four) times daily as needed for mouth pain. (Patient not taking: Reported on 04/21/2018)   No facility-administered encounter medications on file as of 06/23/2018.     Surgical History: Past Surgical History:  Procedure Laterality Date  . none      Medical History: Past Medical History:  Diagnosis Date  . Arthritis     Family History: Family History  Family history unknown: Yes    Review of Systems  Constitutional: Negative.  Negative for chills, fatigue and  unexpected weight change.  HENT: Negative.  Negative for congestion, rhinorrhea, sneezing and sore throat.   Eyes: Negative for redness.  Respiratory: Negative.  Negative for cough, chest tightness and shortness of breath.   Cardiovascular: Negative.  Negative for chest pain and palpitations.  Gastrointestinal: Negative.  Negative for abdominal pain, constipation, diarrhea, nausea and vomiting.  Endocrine: Negative.   Genitourinary: Negative.  Negative for dysuria and frequency.  Musculoskeletal: Negative.  Negative for arthralgias, back pain, joint swelling and neck pain.  Skin: Negative.  Negative for rash.  Allergic/Immunologic: Negative.   Neurological: Negative.  Negative for tremors and numbness.  Hematological: Negative for adenopathy. Does not bruise/bleed easily.  Psychiatric/Behavioral: Negative.  Negative for behavioral problems, sleep disturbance and suicidal ideas. The patient is not nervous/anxious.    Vital Signs: BP (!) 148/90   Pulse 78   Resp 16   Ht 5\' 8"  (1.727 m)   Wt 207 lb 12.8 oz (94.3 kg)   SpO2 96%   BMI 31.60 kg/m    Physical Exam  Constitutional: He is oriented to person, place, and time. He appears well-developed and well-nourished. No distress.  HENT:  Head: Normocephalic and atraumatic.  Mouth/Throat: Oropharynx is clear and moist. No oropharyngeal exudate.  Eyes: Pupils are equal, round, and reactive to light. EOM are normal.  Neck: Normal range of motion. Neck supple. No JVD present. No tracheal deviation present. No thyromegaly present.  Cardiovascular: Normal rate, regular  rhythm and normal heart sounds. Exam reveals no gallop and no friction rub.  No murmur heard. Pulmonary/Chest: Effort normal and breath sounds normal. No respiratory distress. He has no wheezes. He has no rales. He exhibits no tenderness.  Abdominal: Soft. There is no tenderness. There is no guarding.  Musculoskeletal: Normal range of motion.  Lymphadenopathy:    He has no  cervical adenopathy.  Neurological: He is alert and oriented to person, place, and time. No cranial nerve deficit.  Skin: Skin is warm and dry. He is not diaphoretic.  Psychiatric: He has a normal mood and affect. His behavior is normal. Judgment and thought content normal.  Nursing note and vitals reviewed.  LABS: Recent Results (from the past 2160 hour(s))  UA/M w/rflx Culture, Routine     Status: None   Collection Time: 04/21/18  9:28 AM  Result Value Ref Range   Specific Gravity, UA 1.024 1.005 - 1.030   pH, UA 5.0 5.0 - 7.5   Color, UA Yellow Yellow   Appearance Ur Clear Clear   Leukocytes, UA Negative Negative   Protein, UA Negative Negative/Trace   Glucose, UA Negative Negative   Ketones, UA Negative Negative   RBC, UA Negative Negative   Bilirubin, UA Negative Negative   Urobilinogen, Ur 0.2 0.2 - 1.0 mg/dL   Nitrite, UA Negative Negative   Microscopic Examination Comment     Comment: Microscopic follows if indicated.   Microscopic Examination See below:     Comment: Microscopic was indicated and was performed.   Urinalysis Reflex Comment     Comment: This specimen will not reflex to a Urine Culture.  Microscopic Examination     Status: None   Collection Time: 04/21/18  9:28 AM  Result Value Ref Range   WBC, UA 0-5 0 - 5 /hpf   RBC, UA None seen 0 - 2 /hpf   Epithelial Cells (non renal) 0-10 0 - 10 /hpf   Casts None seen None seen /lpf   Mucus, UA Present Not Estab.   Bacteria, UA Few None seen/Few  Specimen status report     Status: None (Preliminary result)   Collection Time: 04/21/18  9:28 AM  Result Value Ref Range   specimen status report Comment     Comment: No Micro Urine Received  UA/M w/rflx Culture, Routine     Status: None   Collection Time: 06/23/18  8:54 AM  Result Value Ref Range   Specific Gravity, UA 1.020 1.005 - 1.030   pH, UA 5.0 5.0 - 7.5   Color, UA Yellow Yellow   Appearance Ur Clear Clear   Leukocytes, UA Negative Negative   Protein,  UA Trace Negative/Trace   Glucose, UA Negative Negative   Ketones, UA Negative Negative   RBC, UA Negative Negative   Bilirubin, UA Negative Negative   Urobilinogen, Ur 0.2 0.2 - 1.0 mg/dL   Nitrite, UA Negative Negative   Microscopic Examination Comment     Comment: Microscopic follows if indicated.   Microscopic Examination See below:     Comment: Microscopic was indicated and was performed.   Urinalysis Reflex Comment     Comment: This specimen will not reflex to a Urine Culture.  Microscopic Examination     Status: Abnormal   Collection Time: 06/23/18  8:54 AM  Result Value Ref Range   WBC, UA 0-5 0 - 5 /hpf   RBC, UA 0-2 0 - 2 /hpf   Epithelial Cells (non renal) 0-10 0 - 10 /  hpf   Casts None seen None seen /lpf   Crystals Present (A) N/A   Crystal Type Calcium Oxalate N/A   Mucus, UA Present Not Estab.   Bacteria, UA None seen None seen/Few   Depression screen [facility] 2/9 06/23/2018  Decreased Interest 0  Down, Depressed, Hopeless 0  PHQ - 2 Score 0    Functional Status Survey: Is the patient deaf or have difficulty hearing?: Yes Does the patient have difficulty seeing, even when wearing glasses/contacts?: No Does the patient have difficulty concentrating, remembering, or making decisions?: No Does the patient have difficulty walking or climbing stairs?: No Does the patient have difficulty dressing or bathing?: No Does the patient have difficulty doing errands alone such as visiting a doctor's office or shopping?: No  MMSE - [place] Exam 06/23/2018 04/21/2018  Orientation to time 5 5  Orientation to Place 5 5  Registration 3 3  Attention/ Calculation 5 5  Recall 3 3  Language- name 2 objects 2 2  Language- repeat 1 1  Language- follow 3 step command 3 3  Language- read & follow direction 1 1  Write a sentence 1 1  Copy design 1 1  Total score 30 30    Fall Risk  06/23/2018  Falls in the past year? No     Assessment/Plan: 1. Encounter for general  adult medical examination with abnormal findings Will order lab work, treat as indicated. - CBC with Differential/Platelet - Lipid Panel With LDL/HDL Ratio - TSH - T4, free - Comprehensive metabolic panel  2. Hypertension, unspecified type Blood pressure remains elevated. Questionable complaince with medication.  Will increase Amlodipine to 10mg  daily.  Hypertension Counseling:   The following hypertensive lifestyle modification were recommended and discussed:  1. Limiting alcohol intake to less than 1 oz/day of ethanol:(24 oz of beer or 8 oz of wine or 2 oz of 100-proof whiskey). 2. Take baby ASA 81 mg daily. 3. Importance of regular aerobic exercise and losing weight. 4. Reduce dietary saturated fat and cholesterol intake for overall cardiovascular health. 5. Maintaining adequate dietary potassium, calcium, and magnesium intake. 6. Regular monitoring of the blood pressure. 7. Reduce sodium intake to less than 100 mmol/day (less than 2.3 gm of sodium or less than 6 gm of sodium choride)   3. Non compliance w medication regimen Pt is noncompliant with medication at times. Discussed long term issues with HTN.    4. Tobacco dependence Smoking cessation counseling: 1. Pt acknowledges the risks of long term smoking, she will try to quite smoking. 2. Options for different medications including nicotine products, chewing gum, patch etc, Wellbutrin and Chantix is discussed 3. Goal and date of compete cessation is discussed 4. Total time spent in smoking cessation is 10 min.  5. Alcohol consumption binge drinking Dicussed drinking, encouraged patient to stop due to blood pressure.   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   7. Dysuria - UA/M w/rflx Culture, Routine  8. Screening for prostate cancer - PSA  9. Encounter for screening colonoscopy - Ambulatory referral to Gastroenterology  General  Counseling: Icarus verbalizes understanding of the findings of todays visit and agrees with plan of treatment. I have discussed any further diagnostic evaluation that may be needed or ordered today. We also reviewed his medications today. he has been encouraged to call the office with any questions or concerns that should arise related to todays visit.   Orders  Placed This Encounter  Procedures  . Microscopic Examination  . UA/M w/rflx Culture, Routine  . CBC with Differential/Platelet  . Lipid Panel With LDL/HDL Ratio  . TSH  . T4, free  . Comprehensive metabolic panel  . PSA  . Ambulatory referral to Gastroenterology   Time spent: 25 Minutes  This patient was seen by Orson Gear AGNP-C in Collaboration with Dr Lavera Guise as a part of collaborative care agreement   Lavera Guise, MD  Internal Medicine

## 2018-06-23 NOTE — Patient Instructions (Signed)

## 2018-06-24 LAB — MICROSCOPIC EXAMINATION
Bacteria, UA: NONE SEEN
Casts: NONE SEEN /lpf

## 2018-06-24 LAB — UA/M W/RFLX CULTURE, ROUTINE
Bilirubin, UA: NEGATIVE
Glucose, UA: NEGATIVE
Ketones, UA: NEGATIVE
Leukocytes, UA: NEGATIVE
Nitrite, UA: NEGATIVE
RBC, UA: NEGATIVE
Specific Gravity, UA: 1.02 (ref 1.005–1.030)
Urobilinogen, Ur: 0.2 mg/dL (ref 0.2–1.0)
pH, UA: 5 (ref 5.0–7.5)

## 2018-08-07 ENCOUNTER — Encounter: Payer: Self-pay | Admitting: Adult Health

## 2018-08-07 ENCOUNTER — Ambulatory Visit (INDEPENDENT_AMBULATORY_CARE_PROVIDER_SITE_OTHER): Payer: Medicare HMO | Admitting: Adult Health

## 2018-08-07 VITALS — BP 142/92 | HR 67 | Resp 16 | Ht 68.0 in | Wt 204.6 lb

## 2018-08-07 DIAGNOSIS — I10 Essential (primary) hypertension: Secondary | ICD-10-CM

## 2018-08-07 DIAGNOSIS — Z9114 Patient's other noncompliance with medication regimen: Secondary | ICD-10-CM | POA: Diagnosis not present

## 2018-08-07 DIAGNOSIS — F1010 Alcohol abuse, uncomplicated: Secondary | ICD-10-CM

## 2018-08-07 DIAGNOSIS — F17219 Nicotine dependence, cigarettes, with unspecified nicotine-induced disorders: Secondary | ICD-10-CM

## 2018-08-07 MED ORDER — LOSARTAN POTASSIUM 50 MG PO TABS: 50.0000 mg | ORAL_TABLET | Freq: Every day | ORAL | 0 refills | Status: DC

## 2018-08-07 NOTE — Patient Instructions (Signed)

## 2018-08-07 NOTE — Progress Notes (Signed)
[facility] [place], [place] [address]  Internal MEDICINE  Office Visit Note  Patient Name: Alexander Cooke  [id number]  [id number]  Date of Service: 08/07/2018  Chief Complaint  Patient presents with  . Hypertension    HPI  Pt here for follow up on HTN.   Patient was seen 6 weeks ago and his amlodipine was doubled from 5 mg to 10 mg.  It appears to have had no effect on his blood pressure.  He states that he has taken the medication regularly.  Medication compliance is still in question as patient is by his own admission a heavy alcoholic drinker daily.     Current Medication: Outpatient Encounter Medications as of 08/07/2018  Medication Sig  . amLODipine (NORVASC) 5 MG tablet Take 1 tablet (5 mg total) by mouth daily.  Marland Kitchen losartan (COZAAR) 50 MG tablet Take 1 tablet (50 mg total) by mouth daily.  . [DISCONTINUED] indomethacin (INDOCIN) 50 MG capsule take 1 capsule by mouth once daily  . [DISCONTINUED] loratadine (CLARITIN) 10 MG tablet Take 1 tablet (10 mg total) by mouth daily. (Patient not taking: Reported on 08/07/2018)  . [DISCONTINUED] loratadine (CLARITIN) 10 MG tablet Take by mouth.  . [DISCONTINUED] magic mouthwash w/lidocaine SOLN Take 5 mLs by mouth 4 (four) times daily as needed for mouth pain. (Patient not taking: Reported on 04/21/2018)   No facility-administered encounter medications on file as of 08/07/2018.     Surgical History: Past Surgical History:  Procedure Laterality Date  . none      Medical History: Past Medical History:  Diagnosis Date  . Arthritis   . Hypertension     Family History: Family History  Family history unknown: Yes    Social History   Socioeconomic History  . Marital status: Married    Spouse name: Not on file  . Number of children: Not on file  . Years of education: Not on file  . Highest education level: Not on file  Occupational History  . Not on file  Social Needs  . Financial resource  strain: Not on file  . Food insecurity:    Worry: Not on file    Inability: Not on file  . Transportation needs:    Medical: Not on file    Non-medical: Not on file  Tobacco Use  . Smoking status: Current Every Day Smoker  . Smokeless tobacco: Never Used  Substance and Sexual Activity  . Alcohol use: Yes  . Drug use: Yes    Types: Marijuana  . Sexual activity: Not on file  Lifestyle  . Physical activity:    Days per week: Not on file    Minutes per session: Not on file  . Stress: Not on file  Relationships  . Social connections:    Talks on phone: Not on file    Gets together: Not on file    Attends religious service: Not on file    Active member of club or organization: Not on file    Attends meetings of clubs or organizations: Not on file    Relationship status: Not on file  . Intimate partner violence:    Fear of current or ex partner: Not on file    Emotionally abused: Not on file    Physically abused: Not on file    Forced sexual activity: Not on file  Other Topics Concern  . Not on file  Social History Narrative  . Not on file  Review of Systems  Constitutional: Negative.  Negative for chills, fatigue and unexpected weight change.  HENT: Negative.  Negative for congestion, rhinorrhea, sneezing and sore throat.   Eyes: Negative for redness.  Respiratory: Negative.  Negative for cough, chest tightness and shortness of breath.   Cardiovascular: Negative.  Negative for chest pain and palpitations.  Gastrointestinal: Negative.  Negative for abdominal pain, constipation, diarrhea, nausea and vomiting.  Endocrine: Negative.   Genitourinary: Negative.  Negative for dysuria and frequency.  Musculoskeletal: Negative.  Negative for arthralgias, back pain, joint swelling and neck pain.  Skin: Negative.  Negative for rash.  Allergic/Immunologic: Negative.   Neurological: Negative.  Negative for tremors and numbness.  Hematological: Negative for adenopathy. Does not  bruise/bleed easily.  Psychiatric/Behavioral: Negative.  Negative for behavioral problems, sleep disturbance and suicidal ideas. The patient is not nervous/anxious.     Vital Signs: BP (!) 142/92 (BP Location: Left Arm, Patient Position: Sitting, Cuff Size: Normal)   Pulse 67   Resp 16   Ht 5\' 8"  (1.727 m)   Wt 204 lb 9.6 oz (92.8 kg)   SpO2 98%   BMI 31.11 kg/m    Physical Exam  Constitutional: He is oriented to person, place, and time. He appears well-developed and well-nourished. No distress.  HENT:  Head: Normocephalic and atraumatic.  Mouth/Throat: Oropharynx is clear and moist. No oropharyngeal exudate.  Eyes: Pupils are equal, round, and reactive to light. EOM are normal.  Neck: Normal range of motion. Neck supple. No JVD present. No tracheal deviation present. No thyromegaly present.  Cardiovascular: Normal rate, regular rhythm and normal heart sounds. Exam reveals no gallop and no friction rub.  No murmur heard. Pulmonary/Chest: Effort normal and breath sounds normal. No respiratory distress. He has no wheezes. He has no rales. He exhibits no tenderness.  Abdominal: Soft. There is no tenderness. There is no guarding.  Musculoskeletal: Normal range of motion.  Lymphadenopathy:    He has no cervical adenopathy.  Neurological: He is alert and oriented to person, place, and time. No cranial nerve deficit.  Skin: Skin is warm and dry. He is not diaphoretic.  Psychiatric: He has a normal mood and affect. His behavior is normal. Judgment and thought content normal.  Nursing note and vitals reviewed.  Assessment/Plan: 1. Hypertension, unspecified type We will add losartan 50 mg daily to patient's medication regimen.  Have him follow-up in 4 weeks to see how his pressure is responding.  Again encourage patient that taking medications as prescribed is very important.  He again verbalized understanding. - losartan (COZAAR) 50 MG tablet; Take 1 tablet (50 mg total) by mouth daily.   Dispense: 30 tablet; Refill: 0 Hypertension Counseling:   The following hypertensive lifestyle modification were recommended and discussed:  1. Limiting alcohol intake to less than 1 oz/day of ethanol:(24 oz of beer or 8 oz of wine or 2 oz of 100-proof whiskey). 2. Take baby ASA 81 mg daily. 3. Importance of regular aerobic exercise and losing weight. 4. Reduce dietary saturated fat and cholesterol intake for overall cardiovascular health. 5. Maintaining adequate dietary potassium, calcium, and magnesium intake. 6. Regular monitoring of the blood pressure. 7. Reduce sodium intake to less than 100 mmol/day (less than 2.3 gm of sodium or less than 6 gm of sodium choride)   2. Non compliance w medication regimen Patient's compliance remains in question.  3. Cigarette nicotine dependence with nicotine-induced disorder Patient states he smokes 6 to 10 cigarettes/day.  He is attempting to cut  back. Smoking cessation counseling: 1. Pt acknowledges the risks of long term smoking, she will try to quite smoking. 2. Options for different medications including nicotine products, chewing gum, patch etc, Wellbutrin and Chantix is discussed 3. Goal and date of compete cessation is discussed 4. Total time spent in smoking cessation is 15 min.   4. Alcohol consumption binge drinking Patient continues to binge drink daily.  He is not interested in slowing his consumption or stopping at all at this time.  We discussed that his alcohol consumption could be leading to his high blood pressure and he verbalized understanding.  General Counseling: Alexander Cooke verbalizes understanding of the findings of todays visit and agrees with plan of treatment. I have discussed any further diagnostic evaluation that may be needed or ordered today. We also reviewed his medications today. he has been encouraged to call the office with any questions or concerns that should arise related to todays visit.    No orders of the defined  types were placed in this encounter.   Meds ordered this encounter  Medications  . losartan (COZAAR) 50 MG tablet    Sig: Take 1 tablet (50 mg total) by mouth daily.    Dispense:  30 tablet    Refill:  0    Time spent: 25 Minutes   This patient was seen by Orson Gear AGNP-C in Collaboration with Dr Lavera Guise as a part of collaborative care agreement     Kendell Bane AGNP-C Internal medicine

## 2018-09-04 ENCOUNTER — Ambulatory Visit (INDEPENDENT_AMBULATORY_CARE_PROVIDER_SITE_OTHER): Payer: Medicare HMO | Admitting: Adult Health

## 2018-09-04 ENCOUNTER — Encounter: Payer: Self-pay | Admitting: Adult Health

## 2018-09-04 VITALS — BP 121/82 | HR 66 | Resp 16 | Ht 68.0 in | Wt 207.0 lb

## 2018-09-04 DIAGNOSIS — F17219 Nicotine dependence, cigarettes, with unspecified nicotine-induced disorders: Secondary | ICD-10-CM | POA: Diagnosis not present

## 2018-09-04 DIAGNOSIS — I10 Essential (primary) hypertension: Secondary | ICD-10-CM

## 2018-09-04 DIAGNOSIS — F1010 Alcohol abuse, uncomplicated: Secondary | ICD-10-CM

## 2018-09-04 MED ORDER — AMLODIPINE BESYLATE 5 MG PO TABS: 5.0000 mg | ORAL_TABLET | Freq: Every day | ORAL | 3 refills | Status: DC

## 2018-09-04 MED ORDER — LOSARTAN POTASSIUM 50 MG PO TABS: 50.0000 mg | ORAL_TABLET | Freq: Every day | ORAL | 3 refills | Status: DC

## 2018-09-04 NOTE — Patient Instructions (Signed)

## 2018-09-04 NOTE — Progress Notes (Signed)
[facility] [place], [place] [address]  Internal MEDICINE  Office Visit Note  Patient Name: Alexander Cooke  [id number]  [id number]  Date of Service: 09/04/2018  Chief Complaint  Patient presents with  . Hypertension    HPI  Patient is here for follow-up on hypertension.  At last visit patient with 80 mg of losartan daily.  This medication change appears to have decreased his blood pressure.  He denies any chest pain, shortness of breath, palpitations or headaches.  Patient reports he has continued to drink 2-4 beers per day and binge drinking more than 5 beers some days.  He also has continued to smoke 3 to 5 cigarettes/day.   Current Medication: Outpatient Encounter Medications as of 09/04/2018  Medication Sig  . amLODipine (NORVASC) 5 MG tablet Take 1 tablet (5 mg total) by mouth daily.  Marland Kitchen losartan (COZAAR) 50 MG tablet Take 1 tablet (50 mg total) by mouth daily.  . [DISCONTINUED] amLODipine (NORVASC) 5 MG tablet Take 1 tablet (5 mg total) by mouth daily.  . [DISCONTINUED] losartan (COZAAR) 50 MG tablet Take 1 tablet (50 mg total) by mouth daily.   No facility-administered encounter medications on file as of 09/04/2018.     Surgical History: Past Surgical History:  Procedure Laterality Date  . none      Medical History: Past Medical History:  Diagnosis Date  . Arthritis   . Hypertension     Family History: Family History  Family history unknown: Yes    Social History   Socioeconomic History  . Marital status: Married    Spouse name: Not on file  . Number of children: Not on file  . Years of education: Not on file  . Highest education level: Not on file  Occupational History  . Not on file  Social Needs  . Financial resource strain: Not on file  . Food insecurity:    Worry: Not on file    Inability: Not on file  . Transportation needs:    Medical: Not on file    Non-medical: Not on file  Tobacco Use  . Smoking status:  Current Every Day Smoker  . Smokeless tobacco: Never Used  Substance and Sexual Activity  . Alcohol use: Yes  . Drug use: Yes    Types: Marijuana  . Sexual activity: Not on file  Lifestyle  . Physical activity:    Days per week: Not on file    Minutes per session: Not on file  . Stress: Not on file  Relationships  . Social connections:    Talks on phone: Not on file    Gets together: Not on file    Attends religious service: Not on file    Active member of club or organization: Not on file    Attends meetings of clubs or organizations: Not on file    Relationship status: Not on file  . Intimate partner violence:    Fear of current or ex partner: Not on file    Emotionally abused: Not on file    Physically abused: Not on file    Forced sexual activity: Not on file  Other Topics Concern  . Not on file  Social History Narrative  . Not on file      Review of Systems  Constitutional: Negative.  Negative for chills, fatigue and unexpected weight change.  HENT: Negative.  Negative for congestion, rhinorrhea, sneezing and sore throat.   Eyes: Negative for redness.  Respiratory: Negative.  Negative for cough, chest tightness and shortness of breath.   Cardiovascular: Negative.  Negative for chest pain and palpitations.  Gastrointestinal: Negative.  Negative for abdominal pain, constipation, diarrhea, nausea and vomiting.  Endocrine: Negative.   Genitourinary: Negative.  Negative for dysuria and frequency.  Musculoskeletal: Negative.  Negative for arthralgias, back pain, joint swelling and neck pain.  Skin: Negative.  Negative for rash.  Allergic/Immunologic: Negative.   Neurological: Negative.  Negative for tremors and numbness.  Hematological: Negative for adenopathy. Does not bruise/bleed easily.  Psychiatric/Behavioral: Negative.  Negative for behavioral problems, sleep disturbance and suicidal ideas. The patient is not nervous/anxious.     Vital Signs: BP 121/82 (BP  Location: Left Arm, Patient Position: Sitting, Cuff Size: Normal)   Pulse 66   Resp 16   Ht 5\' 8"  (1.727 m)   Wt 207 lb (93.9 kg)   SpO2 98%   BMI 31.47 kg/m    Physical Exam  Constitutional: He is oriented to person, place, and time. He appears well-developed and well-nourished. No distress.  HENT:  Head: Normocephalic and atraumatic.  Mouth/Throat: Oropharynx is clear and moist. No oropharyngeal exudate.  Eyes: Pupils are equal, round, and reactive to light. EOM are normal.  Neck: Normal range of motion. Neck supple. No JVD present. No tracheal deviation present. No thyromegaly present.  Cardiovascular: Normal rate, regular rhythm and normal heart sounds. Exam reveals no gallop and no friction rub.  No murmur heard. Pulmonary/Chest: Effort normal and breath sounds normal. No respiratory distress. He has no wheezes. He has no rales. He exhibits no tenderness.  Abdominal: Soft. There is no tenderness. There is no guarding.  Musculoskeletal: Normal range of motion.  Lymphadenopathy:    He has no cervical adenopathy.  Neurological: He is alert and oriented to person, place, and time. No cranial nerve deficit.  Skin: Skin is warm and dry. He is not diaphoretic.  Psychiatric: He has a normal mood and affect. His behavior is normal. Judgment and thought content normal.  Nursing note and vitals reviewed.   Assessment/Plan: 1. Hypertension, unspecified type Patient blood pressure much improved at this visit 121/82.  Again patient denies any symptoms of hypertension and is pleased with his progress.  We will continue losartan and Norvasc as before and follow-up in 3 months.  2. Cigarette nicotine dependence with nicotine-induced disorder Smoking cessation counseling: 1. Pt acknowledges the risks of long term smoking, she will try to quite smoking. 2. Options for different medications including nicotine products, chewing gum, patch etc, Wellbutrin and Chantix is discussed 3. Goal and  date of compete cessation is discussed 4. Total time spent in smoking cessation is 15 min.  3. 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 Discussed cutting back on alcohol consumption.   General Counseling: Deadrian verbalizes understanding of the findings of todays visit and agrees with plan of treatment. I have discussed any further diagnostic evaluation that may be needed or ordered today. We also reviewed his medications today. he has been encouraged to call the office with any questions or concerns that should arise related to todays visit.    No orders of the defined types were placed in this encounter.   Meds ordered this encounter  Medications  . losartan (COZAAR) 50 MG tablet    Sig: Take 1 tablet (50 mg total) by mouth daily.    Dispense:  90 tablet    Refill:  3  . amLODipine (NORVASC) 5 MG tablet    Sig: Take 1 tablet (5  mg total) by mouth daily.    Dispense:  90 tablet    Refill:  3    Time spent: 25 Minutes   This patient was seen by Orson Gear AGNP-C in Collaboration with Dr Lavera Guise as a part of collaborative care agreement     Kendell Bane AGNP-C Internal medicine

## 2018-12-10 ENCOUNTER — Ambulatory Visit: Payer: Self-pay | Admitting: Adult Health

## 2018-12-11 ENCOUNTER — Encounter: Payer: Self-pay | Admitting: Adult Health

## 2018-12-11 ENCOUNTER — Ambulatory Visit (INDEPENDENT_AMBULATORY_CARE_PROVIDER_SITE_OTHER): Payer: Medicare HMO | Admitting: Adult Health

## 2018-12-11 ENCOUNTER — Other Ambulatory Visit: Payer: Self-pay

## 2018-12-11 VITALS — BP 140/90 | HR 67 | Resp 16 | Ht 68.0 in | Wt 205.0 lb

## 2018-12-11 DIAGNOSIS — Z9114 Patient's other noncompliance with medication regimen: Secondary | ICD-10-CM | POA: Diagnosis not present

## 2018-12-11 DIAGNOSIS — F17219 Nicotine dependence, cigarettes, with unspecified nicotine-induced disorders: Secondary | ICD-10-CM

## 2018-12-11 DIAGNOSIS — I10 Essential (primary) hypertension: Secondary | ICD-10-CM

## 2018-12-11 DIAGNOSIS — F1010 Alcohol abuse, uncomplicated: Secondary | ICD-10-CM

## 2018-12-11 NOTE — Progress Notes (Signed)
[facility] [place], [place] [address]  Internal MEDICINE  Office Visit Note  Patient Name: Alexander Cooke  [id number]  [id number]  Date of Service: 12/11/2018  Chief Complaint  Patient presents with  . Hypertension    HPI  PT is here for follow up on HTN.  Pt reports he has been taking his medicine intermittently.  He denies any side effects and states he is taking his pressure at home.  When he needs his blood pressure medicine he takes it. He continues to reports moderate alcohol consumption as well as, 2-3 cigarettes a day.  Denies chest pain, sob, palpitations or headaches.     Current Medication: Outpatient Encounter Medications as of 12/11/2018  Medication Sig  . amLODipine (NORVASC) 5 MG tablet Take 1 tablet (5 mg total) by mouth daily.  Marland Kitchen losartan (COZAAR) 50 MG tablet Take 1 tablet (50 mg total) by mouth daily.   No facility-administered encounter medications on file as of 12/11/2018.     Surgical History: Past Surgical History:  Procedure Laterality Date  . none      Medical History: Past Medical History:  Diagnosis Date  . Arthritis   . Hypertension     Family History: Family History  Family history unknown: Yes    Social History   Socioeconomic History  . Marital status: Married    Spouse name: Not on file  . Number of children: Not on file  . Years of education: Not on file  . Highest education level: Not on file  Occupational History  . Not on file  Social Needs  . Financial resource strain: Not on file  . Food insecurity:    Worry: Not on file    Inability: Not on file  . Transportation needs:    Medical: Not on file    Non-medical: Not on file  Tobacco Use  . Smoking status: Current Every Day Smoker  . Smokeless tobacco: Never Used  Substance and Sexual Activity  . Alcohol use: Yes  . Drug use: Yes    Types: Marijuana  . Sexual activity: Not on file  Lifestyle  . Physical activity:    Days per week:  Not on file    Minutes per session: Not on file  . Stress: Not on file  Relationships  . Social connections:    Talks on phone: Not on file    Gets together: Not on file    Attends religious service: Not on file    Active member of club or organization: Not on file    Attends meetings of clubs or organizations: Not on file    Relationship status: Not on file  . Intimate partner violence:    Fear of current or ex partner: Not on file    Emotionally abused: Not on file    Physically abused: Not on file    Forced sexual activity: Not on file  Other Topics Concern  . Not on file  Social History Narrative  . Not on file      Review of Systems  Constitutional: Negative.  Negative for chills, fatigue and unexpected weight change.  HENT: Negative.  Negative for congestion, rhinorrhea, sneezing and sore throat.   Eyes: Negative for redness.  Respiratory: Negative.  Negative for cough, chest tightness and shortness of breath.   Cardiovascular: Negative.  Negative for chest pain and palpitations.  Gastrointestinal: Negative.  Negative for abdominal pain, constipation, diarrhea, nausea and vomiting.  Endocrine: Negative.   Genitourinary:  Negative.  Negative for dysuria and frequency.  Musculoskeletal: Negative.  Negative for arthralgias, back pain, joint swelling and neck pain.  Skin: Negative.  Negative for rash.  Allergic/Immunologic: Negative.   Neurological: Negative.  Negative for tremors and numbness.  Hematological: Negative for adenopathy. Does not bruise/bleed easily.  Psychiatric/Behavioral: Negative.  Negative for behavioral problems, sleep disturbance and suicidal ideas. The patient is not nervous/anxious.     Vital Signs: BP 140/90   Pulse 67   Resp 16   Ht 5\' 8"  (1.727 m)   Wt 205 lb (93 kg)   SpO2 98%   BMI 31.17 kg/m    Physical Exam Vitals signs and nursing note reviewed.  Constitutional:      General: He is not in acute distress.    Appearance: He is  well-developed. He is not diaphoretic.  HENT:     Head: Normocephalic and atraumatic.     Mouth/Throat:     Pharynx: No oropharyngeal exudate.  Eyes:     Pupils: Pupils are equal, round, and reactive to light.  Neck:     Musculoskeletal: Normal range of motion and neck supple.     Thyroid: No thyromegaly.     Vascular: No JVD.     Trachea: No tracheal deviation.  Cardiovascular:     Rate and Rhythm: Normal rate and regular rhythm.     Heart sounds: Normal heart sounds. No murmur. No friction rub. No gallop.   Pulmonary:     Effort: Pulmonary effort is normal. No respiratory distress.     Breath sounds: Normal breath sounds. No wheezing or rales.  Chest:     Chest wall: No tenderness.  Abdominal:     Palpations: Abdomen is soft.     Tenderness: There is no abdominal tenderness. There is no guarding.  Musculoskeletal: Normal range of motion.  Lymphadenopathy:     Cervical: No cervical adenopathy.  Skin:    General: Skin is warm and dry.  Neurological:     Mental Status: He is alert and oriented to person, place, and time.     Cranial Nerves: No cranial nerve deficit.  Psychiatric:        Behavior: Behavior normal.        Thought Content: Thought content normal.        Judgment: Judgment normal.      Assessment/Plan: 1. Hypertension, unspecified type Stable, at this time.  140/90.  Encouraged patient to take medications as prescribed.   2. Cigarette nicotine dependence with nicotine-induced disorder Smoking cessation counseling: 1. Pt acknowledges the risks of long term smoking, she will try to quite smoking. 2. Options for different medications including nicotine products, chewing gum, patch etc, Wellbutrin and Chantix is discussed 3. Goal and date of compete cessation is discussed 4. Total time spent in smoking cessation is 15 min.  3. Alcohol consumption binge drinking Continues to drink 3-5 drinks daily.   4. Non compliance w medication regimen Discussed  compliance with patient.  Discussed importance of keeping his blood pressure under control and the role his medications plays in that.    General Counseling: Lucile verbalizes understanding of the findings of todays visit and agrees with plan of treatment. I have discussed any further diagnostic evaluation that may be needed or ordered today. We also reviewed his medications today. he has been encouraged to call the office with any questions or concerns that should arise related to todays visit.    No orders of the defined types were placed  in this encounter.   No orders of the defined types were placed in this encounter.   Time spent: 25 Minutes   This patient was seen by Orson Gear AGNP-C in Collaboration with Dr Lavera Guise as a part of collaborative care agreement     Kendell Bane AGNP-C Internal medicine

## 2018-12-13 ENCOUNTER — Encounter: Payer: Self-pay | Admitting: Adult Health

## 2019-03-17 ENCOUNTER — Ambulatory Visit: Payer: Medicare HMO | Admitting: Adult Health

## 2019-03-23 ENCOUNTER — Ambulatory Visit (INDEPENDENT_AMBULATORY_CARE_PROVIDER_SITE_OTHER): Payer: Medicare HMO | Admitting: Adult Health

## 2019-03-23 ENCOUNTER — Other Ambulatory Visit: Payer: Self-pay

## 2019-03-23 ENCOUNTER — Encounter: Payer: Self-pay | Admitting: Adult Health

## 2019-03-23 VITALS — BP 138/89 | HR 64 | Resp 16 | Ht 68.0 in | Wt 197.0 lb

## 2019-03-23 DIAGNOSIS — Z9114 Patient's other noncompliance with medication regimen: Secondary | ICD-10-CM | POA: Diagnosis not present

## 2019-03-23 DIAGNOSIS — F17219 Nicotine dependence, cigarettes, with unspecified nicotine-induced disorders: Secondary | ICD-10-CM

## 2019-03-23 DIAGNOSIS — I10 Essential (primary) hypertension: Secondary | ICD-10-CM | POA: Diagnosis not present

## 2019-03-23 DIAGNOSIS — F1010 Alcohol abuse, uncomplicated: Secondary | ICD-10-CM | POA: Diagnosis not present

## 2019-03-23 NOTE — Progress Notes (Signed)
[facility] [place], [place] [address]  Internal MEDICINE  Office Visit Note  Patient Name: Alexander Cooke  [id number]  [id number]  Date of Service: 03/23/2019  Chief Complaint  Patient presents with  . Medical Management of Chronic Issues  . Hypertension    3 month follow up     HPI  Pt is here for follow up on HTN. Pt reports he has not been taking his medication for bp because he has been dieting and exercising.  He has been taking his bp at home and is getting readings within normal limits. He has lost approximatley 10-12 pounds in the last 6-8 months with exercise.  His blood pressure in office today is good.  He does not wish to take his bp meds at this time.    Current Medication: Outpatient Encounter Medications as of 03/23/2019  Medication Sig  . amLODipine (NORVASC) 5 MG tablet Take 1 tablet (5 mg total) by mouth daily.  Marland Kitchen losartan (COZAAR) 50 MG tablet Take 1 tablet (50 mg total) by mouth daily.   No facility-administered encounter medications on file as of 03/23/2019.     Surgical History: Past Surgical History:  Procedure Laterality Date  . none      Medical History: Past Medical History:  Diagnosis Date  . Arthritis   . Hypertension     Family History: Family History  Family history unknown: Yes    Social History   Socioeconomic History  . Marital status: Married    Spouse name: Not on file  . Number of children: Not on file  . Years of education: Not on file  . Highest education level: Not on file  Occupational History  . Not on file  Social Needs  . Financial resource strain: Not on file  . Food insecurity    Worry: Not on file    Inability: Not on file  . Transportation needs    Medical: Not on file    Non-medical: Not on file  Tobacco Use  . Smoking status: Current Every Day Smoker  . Smokeless tobacco: Never Used  Substance and Sexual Activity  . Alcohol use: Yes  . Drug use: Yes    Types: Marijuana   . Sexual activity: Not on file  Lifestyle  . Physical activity    Days per week: Not on file    Minutes per session: Not on file  . Stress: Not on file  Relationships  . Social Herbalist on phone: Not on file    Gets together: Not on file    Attends religious service: Not on file    Active member of club or organization: Not on file    Attends meetings of clubs or organizations: Not on file    Relationship status: Not on file  . Intimate partner violence    Fear of current or ex partner: Not on file    Emotionally abused: Not on file    Physically abused: Not on file    Forced sexual activity: Not on file  Other Topics Concern  . Not on file  Social History Narrative  . Not on file      Review of Systems  Constitutional: Negative.  Negative for chills, fatigue and unexpected weight change.  HENT: Negative.  Negative for congestion, rhinorrhea, sneezing and sore throat.   Eyes: Negative for redness.  Respiratory: Negative.  Negative for cough, chest tightness and shortness of breath.   Cardiovascular: Negative.  Negative for chest pain and palpitations.  Gastrointestinal: Negative.  Negative for abdominal pain, constipation, diarrhea, nausea and vomiting.  Endocrine: Negative.   Genitourinary: Negative.  Negative for dysuria and frequency.  Musculoskeletal: Negative.  Negative for arthralgias, back pain, joint swelling and neck pain.  Skin: Negative.  Negative for rash.  Allergic/Immunologic: Negative.   Neurological: Negative.  Negative for tremors and numbness.  Hematological: Negative for adenopathy. Does not bruise/bleed easily.  Psychiatric/Behavioral: Negative.  Negative for behavioral problems, sleep disturbance and suicidal ideas. The patient is not nervous/anxious.     Vital Signs: BP 138/89   Pulse 64   Resp 16   Ht 5\' 8"  (1.727 m)   Wt 197 lb (89.4 kg)   SpO2 98%   BMI 29.95 kg/m    Physical Exam Vitals signs and nursing note reviewed.   Constitutional:      General: He is not in acute distress.    Appearance: He is well-developed. He is not diaphoretic.  HENT:     Head: Normocephalic and atraumatic.     Mouth/Throat:     Pharynx: No oropharyngeal exudate.  Eyes:     Pupils: Pupils are equal, round, and reactive to light.  Neck:     Musculoskeletal: Normal range of motion and neck supple.     Thyroid: No thyromegaly.     Vascular: No JVD.     Trachea: No tracheal deviation.  Cardiovascular:     Rate and Rhythm: Normal rate and regular rhythm.     Heart sounds: Normal heart sounds. No murmur. No friction rub. No gallop.   Pulmonary:     Effort: Pulmonary effort is normal. No respiratory distress.     Breath sounds: Normal breath sounds. No wheezing or rales.  Chest:     Chest wall: No tenderness.  Abdominal:     Palpations: Abdomen is soft.     Tenderness: There is no abdominal tenderness. There is no guarding.  Musculoskeletal: Normal range of motion.  Lymphadenopathy:     Cervical: No cervical adenopathy.  Skin:    General: Skin is warm and dry.  Neurological:     Mental Status: He is alert and oriented to person, place, and time.     Cranial Nerves: No cranial nerve deficit.  Psychiatric:        Behavior: Behavior normal.        Thought Content: Thought content normal.        Judgment: Judgment normal.     Assessment/Plan: 1. Hypertension, unspecified type Doing well currently due to his weight loss, diet change and exercise regiment.  Encouraged pt to continue this and to continue to observe his blood pressure readings.   2. Cigarette nicotine dependence with nicotine-induced disorder Smoking cessation counseling: 1. Pt acknowledges the risks of long term smoking, she will try to quite smoking. 2. Options for different medications including nicotine products, chewing gum, patch etc, Wellbutrin and Chantix is discussed 3. Goal and date of compete cessation is discussed 4. Total time spent in  smoking cessation is 15 min.   3. Alcohol consumption binge drinking Continues to drink most everyday.  Once again discussed cessation and moderation.  4. Non compliance w medication regimen Does not wish to taken medications for bp at this time. Managing with diet currently.    General Counseling: Sharief verbalizes understanding of the findings of todays visit and agrees with plan of treatment. I have discussed any further diagnostic evaluation that may be needed or ordered today.  We also reviewed his medications today. he has been encouraged to call the office with any questions or concerns that should arise related to todays visit.    No orders of the defined types were placed in this encounter.   No orders of the defined types were placed in this encounter.   Time spent: 20 Minutes   This patient was seen by Orson Gear AGNP-C in Collaboration with Dr Lavera Guise as a part of collaborative care agreement     Kendell Bane AGNP-C Internal medicine

## 2019-06-25 ENCOUNTER — Encounter: Payer: Self-pay | Admitting: Adult Health

## 2019-06-25 ENCOUNTER — Ambulatory Visit (INDEPENDENT_AMBULATORY_CARE_PROVIDER_SITE_OTHER): Payer: Medicare HMO | Admitting: Adult Health

## 2019-06-25 ENCOUNTER — Other Ambulatory Visit: Payer: Self-pay

## 2019-06-25 VITALS — BP 142/90 | HR 65 | Resp 16 | Ht 68.0 in | Wt 198.0 lb

## 2019-06-25 DIAGNOSIS — Z0001 Encounter for general adult medical examination with abnormal findings: Secondary | ICD-10-CM

## 2019-06-25 DIAGNOSIS — I10 Essential (primary) hypertension: Secondary | ICD-10-CM

## 2019-06-25 DIAGNOSIS — F1010 Alcohol abuse, uncomplicated: Secondary | ICD-10-CM

## 2019-06-25 DIAGNOSIS — Z125 Encounter for screening for malignant neoplasm of prostate: Secondary | ICD-10-CM | POA: Diagnosis not present

## 2019-06-25 DIAGNOSIS — M1A9XX0 Chronic gout, unspecified, without tophus (tophi): Secondary | ICD-10-CM

## 2019-06-25 DIAGNOSIS — R300 Dysuria: Secondary | ICD-10-CM

## 2019-06-25 DIAGNOSIS — F17219 Nicotine dependence, cigarettes, with unspecified nicotine-induced disorders: Secondary | ICD-10-CM

## 2019-06-25 DIAGNOSIS — M1990 Unspecified osteoarthritis, unspecified site: Secondary | ICD-10-CM | POA: Diagnosis not present

## 2019-06-25 MED ORDER — AMLODIPINE BESYLATE 5 MG PO TABS: 5.0000 mg | ORAL_TABLET | Freq: Every day | ORAL | 3 refills | Status: DC

## 2019-06-25 MED ORDER — LOSARTAN POTASSIUM 50 MG PO TABS: 50.0000 mg | ORAL_TABLET | Freq: Every day | ORAL | 3 refills | Status: DC

## 2019-06-25 NOTE — Progress Notes (Signed)
[facility] [place], [place] [address]  Internal MEDICINE  Office Visit Note  Patient Name: Alexander Cooke  [id number]  [id number]  Date of Service: 06/25/2019  Chief Complaint  Patient presents with  . Medical Management of Chronic Issues  . Annual Exam    medicare wellness visit   . Hypertension  . Quality Metric Gaps    colonoscopy was refused      HPI Pt is here for routine health maintenance examination. No acute issues today. Reports that he is not taking any medications, stopped his blood pressure medication several months ago. Self-monitors blood pressure at home at least twice a week with average 130/80. Denies headache or vision changes. No issues to report with arthritis denies needing to take OTC medications. Reports smoking 1-2 cigarettes a day and 1-2 beers a day. Denies chest pain and shortness of breath.  Current Medication: Outpatient Encounter Medications as of 06/25/2019  Medication Sig  . amLODipine (NORVASC) 5 MG tablet Take 1 tablet (5 mg total) by mouth daily. (Patient not taking: Reported on 06/25/2019)  . losartan (COZAAR) 50 MG tablet Take 1 tablet (50 mg total) by mouth daily. (Patient not taking: Reported on 06/25/2019)   No facility-administered encounter medications on file as of 06/25/2019.     Surgical History: Past Surgical History:  Procedure Laterality Date  . none      Medical History: Past Medical History:  Diagnosis Date  . Arthritis   . Hypertension     Family History: Family History  Family history unknown: Yes      Review of Systems  Constitutional: Negative.  Negative for chills, fatigue and unexpected weight change.  HENT: Negative.  Negative for congestion, rhinorrhea, sneezing and sore throat.   Eyes: Negative for redness.  Respiratory: Negative.  Negative for cough, chest tightness and shortness of breath.   Cardiovascular: Negative.  Negative for chest pain and palpitations.   Gastrointestinal: Negative.  Negative for abdominal pain, constipation, diarrhea, nausea and vomiting.  Endocrine: Negative.   Genitourinary: Negative.  Negative for dysuria and frequency.  Musculoskeletal: Negative.  Negative for arthralgias, back pain, joint swelling and neck pain.  Skin: Negative.  Negative for rash.  Allergic/Immunologic: Negative.   Neurological: Negative.  Negative for tremors and numbness.  Hematological: Negative for adenopathy. Does not bruise/bleed easily.  Psychiatric/Behavioral: Negative.  Negative for behavioral problems, sleep disturbance and suicidal ideas. The patient is not nervous/anxious.      Vital Signs: BP (!) 142/90   Pulse 65   Resp 16   Ht 5\' 8"  (1.727 m)   Wt 198 lb (89.8 kg)   SpO2 98%   BMI 30.11 kg/m    Physical Exam Vitals signs and nursing note reviewed.  Constitutional:      General: He is not in acute distress.    Appearance: He is well-developed. He is not diaphoretic.  HENT:     Head: Normocephalic and atraumatic.     Mouth/Throat:     Pharynx: No oropharyngeal exudate.  Eyes:     Pupils: Pupils are equal, round, and reactive to light.  Neck:     Musculoskeletal: Normal range of motion and neck supple.     Thyroid: No thyromegaly.     Vascular: No JVD.     Trachea: No tracheal deviation.  Cardiovascular:     Rate and Rhythm: Normal rate and regular rhythm.     Heart sounds: Normal heart sounds. No murmur. No friction rub. No gallop.  Pulmonary:     Effort: Pulmonary effort is normal. No respiratory distress.     Breath sounds: Normal breath sounds. No wheezing or rales.  Chest:     Chest wall: No tenderness.  Abdominal:     Palpations: Abdomen is soft.  Musculoskeletal: Normal range of motion.  Lymphadenopathy:     Cervical: No cervical adenopathy.  Skin:    General: Skin is warm and dry.  Neurological:     Mental Status: He is alert and oriented to person, place, and time.     Cranial Nerves: No cranial  nerve deficit.  Psychiatric:        Behavior: Behavior normal.        Thought Content: Thought content normal.        Judgment: Judgment normal.      LABS: No results found for this or any previous visit (from the past 2160 hour(s)).      Assessment/Plan: 1. Encounter for general adult medical examination with abnormal findings Well appearing elderly male with no acute issues. Will follow-up with labs ordered. - CBC with Differential/Platelet - Lipid Panel With LDL/HDL Ratio - TSH - T4, free - Comprehensive metabolic panel  2. Essential hypertension Not currently taking blood pressure medication. Blood pressure slightly elevated today, discussed with patient importance of taking medication. Patient requested for refills of medication, sent to pharmacy.  3. Arthritis Stable at this time, continue current therapy.  4. Chronic gout without tophus, unspecified cause, unspecified site Stable at this time, no recent acute flares. Continue to monitor.  5. Alcohol consumption binge drinking Patient reports drinking 1-2 beers a day. Discussed with patient importance of not drinking on a daily basis and the negative health risks associated with alcohol consumption.  6. Screening for prostate cancer No issues to report toady, will follow-up with lab work. - PSA  7. Cigarette nicotine dependence with nicotine-induced disorder Patient reports smoking 1-2 cigarettes per day, discussed with patient the importance of smoking cessation and the negative health risk associated with smoking.  8. Dysuria No issues to report today, will follow-up on urine studies. - UA/M w/rflx Culture, Routine  General Counseling: Tiegan verbalizes understanding of the findings of todays visit and agrees with plan of treatment. I have discussed any further diagnostic evaluation that may be needed or ordered today. We also reviewed his medications today. he has been encouraged to call the office with any  questions or concerns that should arise related to todays visit.   Orders Placed This Encounter  Procedures  . UA/M w/rflx Culture, Routine    No orders of the defined types were placed in this encounter.   Time spent: 30 Minutes   This patient was seen by Orson Gear AGNP-C in Collaboration with Dr Lavera Guise as a part of collaborative care agreement    Kendell Bane AGNP-C Internal Medicine

## 2019-06-26 LAB — UA/M W/RFLX CULTURE, ROUTINE
Bilirubin, UA: NEGATIVE
Glucose, UA: NEGATIVE
Ketones, UA: NEGATIVE
Leukocytes,UA: NEGATIVE
Nitrite, UA: NEGATIVE
RBC, UA: NEGATIVE
Specific Gravity, UA: 1.021 (ref 1.005–1.030)
Urobilinogen, Ur: 0.2 mg/dL (ref 0.2–1.0)
pH, UA: 5 (ref 5.0–7.5)

## 2019-06-26 LAB — MICROSCOPIC EXAMINATION
Bacteria, UA: NONE SEEN
Casts: NONE SEEN /lpf
Epithelial Cells (non renal): NONE SEEN /hpf (ref 0–10)

## 2019-09-07 DIAGNOSIS — E756 Lipid storage disorder, unspecified: Secondary | ICD-10-CM | POA: Diagnosis not present

## 2019-09-07 DIAGNOSIS — E079 Disorder of thyroid, unspecified: Secondary | ICD-10-CM | POA: Diagnosis not present

## 2019-09-07 DIAGNOSIS — Z125 Encounter for screening for malignant neoplasm of prostate: Secondary | ICD-10-CM | POA: Diagnosis not present

## 2019-09-07 DIAGNOSIS — Z0001 Encounter for general adult medical examination with abnormal findings: Secondary | ICD-10-CM | POA: Diagnosis not present

## 2019-09-08 LAB — COMPREHENSIVE METABOLIC PANEL
ALT: 21 IU/L (ref 0–44)
AST: 26 IU/L (ref 0–40)
Albumin/Globulin Ratio: 1.8 (ref 1.2–2.2)
Albumin: 4.7 g/dL (ref 3.8–4.8)
Alkaline Phosphatase: 102 IU/L (ref 39–117)
BUN/Creatinine Ratio: 12 (ref 10–24)
BUN: 15 mg/dL (ref 8–27)
Bilirubin Total: 0.9 mg/dL (ref 0.0–1.2)
CO2: 19 mmol/L — ABNORMAL LOW (ref 20–29)
Calcium: 9.5 mg/dL (ref 8.6–10.2)
Chloride: 106 mmol/L (ref 96–106)
Creatinine, Ser: 1.29 mg/dL — ABNORMAL HIGH (ref 0.76–1.27)
GFR calc Af Amer: 65 mL/min/{1.73_m2} (ref >59)
GFR calc non Af Amer: 57 mL/min/{1.73_m2} — ABNORMAL LOW (ref >59)
Globulin, Total: 2.6 g/dL (ref 1.5–4.5)
Glucose: 103 mg/dL — ABNORMAL HIGH (ref 65–99)
Potassium: 4.3 mmol/L (ref 3.5–5.2)
Sodium: 139 mmol/L (ref 134–144)
Total Protein: 7.3 g/dL (ref 6.0–8.5)

## 2019-09-08 LAB — LIPID PANEL WITH LDL/HDL RATIO
Cholesterol, Total: 166 mg/dL (ref 100–199)
HDL: 66 mg/dL (ref >39)
LDL Chol Calc (NIH): 68 mg/dL (ref 0–99)
LDL/HDL Ratio: 1 ratio (ref 0.0–3.6)
Triglycerides: 199 mg/dL — ABNORMAL HIGH (ref 0–149)
VLDL Cholesterol Cal: 32 mg/dL (ref 5–40)

## 2019-09-08 LAB — CBC WITH DIFFERENTIAL/PLATELET
Basophils Absolute: 0 10*3/uL (ref 0.0–0.2)
Basos: 1 %
EOS (ABSOLUTE): 0.1 10*3/uL (ref 0.0–0.4)
Eos: 2 %
Hematocrit: 46.7 % (ref 37.5–51.0)
Hemoglobin: 15.8 g/dL (ref 13.0–17.7)
Immature Grans (Abs): 0 10*3/uL (ref 0.0–0.1)
Immature Granulocytes: 0 %
Lymphocytes Absolute: 2.1 10*3/uL (ref 0.7–3.1)
Lymphs: 28 %
MCH: 31.6 pg (ref 26.6–33.0)
MCHC: 33.8 g/dL (ref 31.5–35.7)
MCV: 93 fL (ref 79–97)
Monocytes Absolute: 0.5 10*3/uL (ref 0.1–0.9)
Monocytes: 7 %
Neutrophils Absolute: 4.6 10*3/uL (ref 1.4–7.0)
Neutrophils: 62 %
Platelets: 194 10*3/uL (ref 150–450)
RBC: 5 x10E6/uL (ref 4.14–5.80)
RDW: 13.5 % (ref 11.6–15.4)
WBC: 7.4 10*3/uL (ref 3.4–10.8)

## 2019-09-08 LAB — T4, FREE: Free T4: 1.26 ng/dL (ref 0.82–1.77)

## 2019-09-08 LAB — PSA: Prostate Specific Ag, Serum: 6 ng/mL — ABNORMAL HIGH (ref 0.0–4.0)

## 2019-09-08 LAB — TSH: TSH: 0.836 u[IU]/mL (ref 0.450–4.500)

## 2019-09-10 ENCOUNTER — Other Ambulatory Visit: Payer: Self-pay | Admitting: Adult Health

## 2019-09-10 DIAGNOSIS — R972 Elevated prostate specific antigen [PSA]: Secondary | ICD-10-CM

## 2019-09-10 NOTE — Progress Notes (Signed)
Referral to urology for elevated PSA

## 2019-09-14 ENCOUNTER — Other Ambulatory Visit: Payer: Self-pay

## 2019-09-14 ENCOUNTER — Ambulatory Visit (INDEPENDENT_AMBULATORY_CARE_PROVIDER_SITE_OTHER): Payer: Medicare HMO | Admitting: Urology

## 2019-09-14 ENCOUNTER — Encounter: Payer: Self-pay | Admitting: Urology

## 2019-09-14 VITALS — BP 138/92 | HR 68 | Ht 67.0 in | Wt 180.0 lb

## 2019-09-14 DIAGNOSIS — R972 Elevated prostate specific antigen [PSA]: Secondary | ICD-10-CM

## 2019-09-14 NOTE — Progress Notes (Signed)
   09/14/19 10:53 AM   Netta Cedars Oct 04, 1950 [id number]  Referring provider: Ronnell Freshwater, NP [address] [place],  [place] [address]  CC: Elevated PSA  HPI: I saw Mr. Cruze in urology clinic for evaluation of elevated PSA from Leretha Pol, NP.  He is a relatively healthy 68 year old African-American male with past medical history notable for hypertension and nephrolithiasis who was recently found to have a mildly elevated PSA of 6.0 on 09/07/2019.  There are no prior PSA values to compare to.  He denies any family history of prostate, breast, or other cancers.  He denies any difficulty with erections.  He denies any prior abdominal surgeries.  He has occasional urinary frequency urgency and nocturia, but this is primarily related to his beer intake.  If he is not drinking alcohol, he has minimal urinary symptoms.   PMH: Past Medical History:  Diagnosis Date  . Arthritis   . Hypertension     Surgical History: Past Surgical History:  Procedure Laterality Date  . none      Allergies: No Known Allergies  Family History: Family History  Family history unknown: Yes    Social History:  reports that he has been smoking. He has never used smokeless tobacco. He reports current alcohol use. He reports current drug use. Drug: Marijuana.  ROS: Please see flowsheet from today's date for complete review of systems.  Physical Exam: BP (!) 138/92   Pulse 68   Ht 5\' 7"  (1.702 m)   Wt 180 lb (81.6 kg)   BMI 28.19 kg/m    Constitutional:  Alert and oriented, No acute distress. Cardiovascular: No clubbing, cyanosis, or edema. Respiratory: Normal respiratory effort, no increased work of breathing. GI: Abdomen is soft, nontender, nondistended, no abdominal masses DRE: 30 g, smooth, no nodules or masses Lymph: No cervical or inguinal lymphadenopathy. Skin: No rashes, bruises or suspicious lesions. Neurologic: Grossly intact, no focal deficits, moving all 4  extremities. Psychiatric: Normal mood and affect.  Laboratory Data: PSA 09/07/2019: 6.0 No other PSA history  Pertinent Imaging: None to review  Assessment & Plan:   In summary, the patient is a relatively healthy 68 year old male with a normal DRE, no family history of prostate cancer, and mildly elevated isolated PSA of 6.0.  We reviewed the implications of an elevated PSA and the uncertainty surrounding it. In general, a man's PSA increases with age and is produced by both normal and cancerous prostate tissue. The differential diagnosis for elevated PSA includes BPH, prostate cancer, infection, recent intercourse/ejaculation, recent urethroscopic manipulation (foley placement/cystoscopy) or trauma, and prostatitis.   Management of an elevated PSA can include observation or prostate biopsy and we discussed this in detail. Our goal is to detect clinically significant prostate cancers, and manage with either active surveillance, surgery, or radiation for localized disease. Risks of prostate biopsy include bleeding, infection (including life threatening sepsis), pain, and lower urinary symptoms. Hematuria, hematospermia, and blood in the stool are all common after biopsy and can persist up to 4 weeks.   Repeat PSA today with free/total ratio, call with results Pursue prostate biopsy if PSA remains elevated  A total of 40 minutes were spent face-to-face with the patient, greater than 50% was spent in patient education, counseling, and coordination of care regarding PSA screening, prostate cancer, and risks and benefits of prostate biopsy.   Billey Co, [facility] [address], [place], [place] [address] [phone]

## 2019-09-14 NOTE — Patient Instructions (Signed)
Prostate Cancer Screening  The prostate is a walnut-sized gland that is located below the bladder and in front of the rectum in males. The function of the prostate (prostate gland) is to add fluid to semen during ejaculation. Prostate cancer is the second most common type of cancer in men. A screening test for cancer is a test that is done before cancer symptoms start. Screening can help to identify cancer at an early stage, when the cancer can be treated more easily. The recommended prostate cancer screening test is a blood test called the prostate-specific antigen (PSA) test. PSA is a protein that is made in the prostate. As you age, your prostate naturally produces more PSA. Abnormally high PSA levels may be caused by:  Prostate cancer.  An enlarged prostate that is not caused by cancer (benign prostatic hyperplasia, BPH). This condition is very common in older men.  A prostate gland infection (prostatitis).  Medicines to assist with hair growth, such as finasteride. Depending on the PSA results, you may need more tests, such as:  A physical exam to check the size of your prostate gland.  Blood and imaging tests.  A procedure to remove tissue samples from your prostate gland for testing (biopsy). Who should have screening? Screening recommendations vary based on age.  If you are younger than age 40, screening is not recommended.  If you are age 40-54 and you have no risk factors, screening is not recommended.  If you are younger than age 55, ask your health care provider if you need screening if you have one of these risk factors: ? Being of African-American descent. ? Having a family history of prostate cancer.  If you are age 55-69, talk with your health care provider about your need for screening and how often screening should be done.  If you are older than age 70, screening is not recommended. This is because the risks that screening can cause are greater than the benefits  that it may provide (risks outweigh the benefits). If you are at high risk for prostate cancer, your health care provider may recommend that you have screenings more often or start screening at a younger age. You may be at high risk if you:  Are older than age 55.  Are African-American.  Have a father, brother, or uncle who has been diagnosed with prostate cancer. The risk may be higher if your family member's cancer occurred at an early age. What are the benefits of screening? There is a small chance that screening may lower your risk of dying from prostate cancer. The chance is small because prostate cancer is typically a slow-growing cancer, and most men with prostate cancer die from a different cause. What are the risks of screening? The main risk of prostate cancer screening is diagnosing and treating prostate cancer that would never have caused any symptoms or problems (overdiagnosis and overtreatment). PSA screening cannot tell you if your PSA is high due to cancer or a different cause. A prostate biopsy is the only procedure to diagnose prostate cancer. Even the results of a biopsy may not tell you if your cancer needs to be treated. Slow-growing prostate cancer may not need any treatment other than monitoring, so diagnosing and treating it may cause unnecessary stress or other side effects. A prostate biopsy may also cause:  Infection or fever.  A false negative. This is a result that shows that you do not have prostate cancer when you actually do have prostate cancer. Questions   to ask your health care provider  When should I start prostate cancer screening?  What is my risk for prostate cancer?  How often do I need screening?  What type of screening tests do I need?  How do I get my test results?  What do my results mean?  Do I need treatment? Contact a health care provider if:  You have difficulty urinating.  You have pain when you urinate or ejaculate.  You have  blood in your urine or semen.  You have pain in your back or in the area of your prostate.  You have trouble getting or maintaining an erection (erectile dysfunction, ED). Summary  Prostate cancer is a common type of cancer in men. The prostate (prostate gland) is located below the bladder and in front of the rectum. This gland adds fluid to semen during ejaculation.  Prostate cancer screening may identify cancer at an early stage, when the cancer can be treated more easily.  The prostate-specific antigen (PSA) test is the recommended screening test for prostate cancer.  Discuss the risks and benefits of prostate cancer screening with your health care provider. If you are age 57 or older, screening is likely to lead to more risks than benefits (risks outweigh the benefits). This information is not intended to replace advice given to you by your health care provider. Make sure you discuss any questions you have with your health care provider. Document Released: 06/28/2017 Document Revised: 08/30/2017 Document Reviewed: 06/28/2017 Elsevier Patient Education  2020 [employer] Antigen Test Why am I having this test? The prostate-specific antigen (PSA) test is a screening test for prostate cancer. It can identify early signs of prostate cancer, which may allow for more effective treatment. Your health care provider may recommend that you have a PSA test starting at age 61 or that you have one earlier or later, depending on your risk factors for prostate cancer. You may also have a PSA test:  To monitor treatment of prostate cancer.  To check whether prostate cancer has returned after treatment.  If you have signs of other conditions that can affect PSA levels, such as: ? An enlarged prostate that is not caused by cancer (benign prostatic hyperplasia, BPH). This condition is very common in older men. ? A prostate infection. What is being tested? This test measures the  amount of PSA in your blood. PSA is a protein that is made in the prostate. The prostate naturally produces more PSA as you age, but very high levels may be a sign of a medical condition. What kind of sample is taken?  A blood sample is required for this test. It is usually collected by inserting a needle into a blood vessel or by sticking a finger with a small needle. Blood for this test should be drawn before having an exam of the prostate. How do I prepare for this test? Do not ejaculate starting 24 hours before your test, or as long as told by your health care provider. Tell a health care provider about:  Any allergies you have.  All medicines you are taking, including vitamins, herbs, eye drops, creams, and over-the-counter medicines. This also includes: ? Medicines to assist with hair growth, such as finasteride. ? Any recent exposure to a medicine called diethylstilbestrol.  Any blood disorders you have.  Any recent procedures you have had, especially any procedures involving the prostate or rectum.  Any medical conditions you have.  Any recent urinary tract infections (  UTIs) you have had. How are the results reported? Your test results will be reported as a value that indicates how much PSA is in your blood. This will be given as nanograms of PSA per milliliter of blood (ng/mL). Your health care provider will compare your results to normal ranges that were established after testing a large group of people (reference ranges). Reference ranges may vary among labs and hospitals. PSA levels vary from person to person and generally increase with age. Because of this variation, there is no single PSA value that is considered normal for everyone. Instead, PSA reference ranges are used to describe whether your PSA levels are considered low or high (elevated). Common reference ranges are:  Low: 0-2.5 ng/mL.  Slightly to moderately elevated: 2.6-10.0 ng/mL.  Moderately elevated: 10.0-19.9  ng/mL.  Significantly elevated: 20 ng/mL or greater. Sometimes, the test results may report that a condition is present when it is not present (false-positive result). What do the results mean? A test result that is higher than 4 ng/mL may mean that you are at an increased risk for prostate cancer. However, a PSA test by itself is not enough to diagnose prostate cancer. High PSA levels may also be caused by the natural aging process, prostate infection, or BPH. PSA screening cannot tell you if your PSA is high due to cancer or a different cause. A prostate biopsy is the only way to diagnose prostate cancer. A risk of having the PSA test is diagnosing and treating prostate cancer that would never have caused any symptoms or problems (overdiagnosis and overtreatment). Talk with your health care provider about what your results mean. Questions to ask your health care provider Ask your health care provider, or the department that is doing the test:  When will my results be ready?  How will I get my results?  What are my treatment options?  What other tests do I need?  What are my next steps? Summary  The prostate-specific antigen (PSA) test is a screening test for prostate cancer.  Your health care provider may recommend that you have a PSA test starting at age 22 or that you have one earlier or later, depending on your risk factors for prostate cancer.  A test result that is higher than 4 ng/mL may mean that you are at an increased risk for prostate cancer. However, elevated levels can be caused by a number of conditions other than prostate cancer.  Talk with your health care provider about what your results mean. This information is not intended to replace advice given to you by your health care provider. Make sure you discuss any questions you have with your health care provider. Document Released: 10/20/2004 Document Revised: 08/30/2017 Document Reviewed: 06/24/2017 Elsevier Patient  Education  2020 [employer].   Transrectal Ultrasound-Guided Prostate Biopsy A transrectal ultrasound-guided prostate biopsy is a procedure to remove samples of prostate tissue for testing. The procedure uses ultrasound images to guide the process of removing the samples. The samples are taken to a lab to be checked for prostate cancer. This procedure is usually done to evaluate the prostate gland of men who have raised (elevated) levels of prostate-specific antigen (PSA), which can be a sign of prostate cancer. Tell a health care provider about:  Any allergies you have.  All medicines you are taking, including vitamins, herbs, eye drops, creams, and over-the-counter medicines.  Any problems you or family members have had with anesthetic medicines.  Any blood disorders you have.  Any  surgeries you have had.  Any medical conditions you have. What are the risks? Generally, this is a safe procedure. However, problems may occur, including:  Prostate infection.  Bleeding from the rectum.  Blood in the urine.  Allergic reactions to medicines.  Damage to surrounding structures such as blood vessels, organs, or muscles.  Difficulty passing urine.  Nerve damage. This is usually temporary.  Pain. What happens before the procedure? Staying hydrated Follow instructions from your health care provider about hydration, which may include:  Up to 2 hours before the procedure - you may continue to drink clear liquids, such as water, clear fruit juice, black coffee, and plain tea.  Eating and drinking restrictions Follow instructions from your health care provider about eating and drinking, which may include:  8 hours before the procedure - stop eating heavy meals or foods such as meat, fried foods, or fatty foods.  6 hours before the procedure - stop eating light meals or foods, such as toast or cereal.  6 hours before the procedure - stop drinking milk or drinks that contain  milk.  2 hours before the procedure - stop drinking clear liquids. Medicines Ask your health care provider about:  Changing or stopping your regular medicines. This is especially important if you are taking diabetes medicines or blood thinners.  Taking over-the-counter medicines, vitamins, herbs, and supplements.  Taking medicines such as aspirin and ibuprofen. These medicines can thin your blood. Do not take these medicines unless your health care provider tells you to take them. General instructions  You may be given antibiotic medicine to help prevent infection. If so, take the antibiotic as told by your health care provider.  You will be given an enema. During an enema, a liquid is injected into your rectum to clear out waste.  You may have a blood or urine sample taken.  Plan to have someone take you home from the hospital or clinic. What happens during the procedure?   To lower your risk of infection: ? Your health care team will wash or sanitize their hands. ? Hair may be removed from the surgical area. ? Your skin will be cleaned with a germ-killing soap. ? You may be given antibiotic medicine.  An IV will be inserted into one of your veins.  You will be given one or both of the following: ? A medicine to help you relax (sedative). ? A medicine to numb the area (local anesthetic).  You will be placed on your left side, and your knees will be bent toward your chest.  A probe with lubricated gel will be placed into your rectum, and images will be taken of your prostate and surrounding structures.  Numbing medicine will be injected into your prostate.  A biopsy needle will be inserted through your rectum and guided to your prostate using the ultrasound images.  Prostate tissue samples will be removed, and the needle will then be removed.  The biopsy samples will be sent to a lab to be tested. The procedure may vary among health care providers and hospitals. What  happens after the procedure?  Your blood pressure, heart rate, breathing rate, and blood oxygen level will be monitored until the medicines you were given have worn off.  You may have some discomfort in the rectal area. You will be given pain medicine as needed.  Do not drive for 24 hours if you received a sedative. Summary  A transrectal ultrasound-guided biopsy removes samples of tissue from your prostate.  This procedure is usually done to evaluate the prostate gland of men who have raised (elevated) levels of prostate-specific antigen (PSA), which can be a sign of prostate cancer.  After your procedure, you may feel some discomfort in the rectal area.  Plan to have someone take you home from the hospital or clinic. This information is not intended to replace advice given to you by your health care provider. Make sure you discuss any questions you have with your health care provider. Document Released: 02/01/2014 Document Revised: 01/07/2019 Document Reviewed: 12/14/2016 Elsevier Patient Education  [employer].

## 2019-09-15 ENCOUNTER — Other Ambulatory Visit: Payer: Self-pay | Admitting: Urology

## 2019-09-15 ENCOUNTER — Telehealth: Payer: Self-pay | Admitting: *Deleted

## 2019-09-15 LAB — FPSA% REFLEX
% FREE PSA: 12.8 %
PSA, FREE: 0.82 ng/mL

## 2019-09-15 LAB — PSA TOTAL (REFLEX TO FREE): Prostate Specific Ag, Serum: 6.4 ng/mL — ABNORMAL HIGH (ref 0.0–4.0)

## 2019-09-15 MED ORDER — DIAZEPAM 5 MG PO TABS: 5.0000 mg | ORAL_TABLET | Freq: Once | ORAL | 0 refills | Status: DC | PRN

## 2019-09-15 NOTE — Telephone Encounter (Addendum)
Patient informed-instructions reviewed and mailed. Biopsy appointment scheduled. Verbalized understanding.   ----- Message from Billey Co, MD sent at 09/15/2019  2:18 PM EST ----- PSA remains elevated at 6.4, we need to schedule prostate biopsy as discussed in clinic. Please review biopsy instructions and schedule for next prostate bx with me. I will send a valium to his pharmacy, thanks  Nickolas Madrid, MD 09/15/2019

## 2019-09-22 NOTE — Progress Notes (Signed)
I see him 09/28/2019.

## 2019-09-24 ENCOUNTER — Telehealth: Payer: Self-pay

## 2019-09-24 NOTE — Telephone Encounter (Signed)
CONFIRMED AND SCREENED FOR 09-28-19 OV. 

## 2019-09-28 ENCOUNTER — Other Ambulatory Visit: Payer: Self-pay | Admitting: Urology

## 2019-09-28 ENCOUNTER — Ambulatory Visit: Payer: Medicare HMO | Admitting: Nurse Practitioner

## 2019-09-28 ENCOUNTER — Other Ambulatory Visit: Payer: Self-pay

## 2019-09-28 ENCOUNTER — Encounter: Payer: Self-pay | Admitting: Nurse Practitioner

## 2019-09-28 VITALS — BP 125/82 | HR 69 | Resp 16 | Ht 67.0 in | Wt 194.0 lb

## 2019-09-28 MED ORDER — DIAZEPAM 5 MG PO TABS: 5.0000 mg | ORAL_TABLET | Freq: Once | ORAL | 0 refills | Status: DC | PRN

## 2019-09-28 NOTE — Telephone Encounter (Signed)
Sent to pharmacy, thanks  Nickolas Madrid, MD 09/28/2019

## 2019-09-30 ENCOUNTER — Other Ambulatory Visit: Payer: Self-pay

## 2019-09-30 ENCOUNTER — Ambulatory Visit: Payer: Medicare HMO | Admitting: Urology

## 2019-10-21 ENCOUNTER — Other Ambulatory Visit: Payer: Self-pay

## 2019-10-21 ENCOUNTER — Other Ambulatory Visit: Payer: Self-pay | Admitting: Urology

## 2019-10-21 ENCOUNTER — Encounter: Payer: Self-pay | Admitting: Urology

## 2019-10-21 ENCOUNTER — Ambulatory Visit: Payer: Medicare HMO | Admitting: Urology

## 2019-10-21 VITALS — BP 140/90 | HR 90 | Ht 67.0 in | Wt 201.0 lb

## 2019-10-21 DIAGNOSIS — R972 Elevated prostate specific antigen [PSA]: Secondary | ICD-10-CM | POA: Diagnosis not present

## 2019-10-21 DIAGNOSIS — C61 Malignant neoplasm of prostate: Secondary | ICD-10-CM | POA: Diagnosis not present

## 2019-10-21 MED ORDER — GENTAMICIN SULFATE 40 MG/ML IJ SOLN
80.0000 mg | Freq: Once | INTRAMUSCULAR | Status: AC
  Administered 2019-10-21: 80 mg via INTRAMUSCULAR

## 2019-10-21 MED ORDER — LEVOFLOXACIN 500 MG PO TABS
500.0000 mg | ORAL_TABLET | Freq: Once | ORAL | Status: AC
  Administered 2019-10-21: 500 mg via ORAL

## 2019-10-21 NOTE — Patient Instructions (Signed)

## 2019-10-21 NOTE — Progress Notes (Signed)
   10/21/19  Indication: Elevated PSA, 6.4  Prostate Biopsy Procedure   Informed consent was obtained, and we discussed the risks of bleeding and infection/sepsis. A time out was performed to ensure correct patient identity.  Pre-Procedure: - Last PSA Level: 6.4, 12.8% free - Gentamicin and levaquin given for antibiotic prophylaxis - Transrectal Ultrasound performed revealing a 38 gm prostate, PSA density 0.17 - No significant hypoechoic or median lobe noted  Procedure: - Prostate block performed using 10 cc 1% lidocaine and biopsies taken from sextant areas, a total of 12 under ultrasound guidance.  Post-Procedure: - Patient tolerated the procedure well - He was counseled to seek immediate medical attention if experiences significant bleeding, fevers, or severe pain - Return in one week to discuss biopsy results  Assessment/ Plan: Will follow up in 1-2 weeks to discuss pathology  Nickolas Madrid, MD 10/21/2019

## 2019-10-27 LAB — ANATOMIC PATHOLOGY REPORT: PDF Image: 0

## 2019-11-04 ENCOUNTER — Encounter: Payer: Self-pay | Admitting: Urology

## 2019-11-04 ENCOUNTER — Other Ambulatory Visit: Payer: Self-pay

## 2019-11-04 ENCOUNTER — Ambulatory Visit (INDEPENDENT_AMBULATORY_CARE_PROVIDER_SITE_OTHER): Payer: Medicare HMO | Admitting: Urology

## 2019-11-04 VITALS — BP 151/98 | HR 77 | Ht 67.0 in | Wt 197.0 lb

## 2019-11-04 DIAGNOSIS — C61 Malignant neoplasm of prostate: Secondary | ICD-10-CM | POA: Diagnosis not present

## 2019-11-04 NOTE — Patient Instructions (Signed)
Prostate Cancer  The prostate is a walnut-sized gland that is involved in the production of semen. It is located below a man's bladder, in front of the rectum. Prostate cancer is the abnormal growth of cells in the prostate gland. What are the causes? The exact cause of this condition is not known. What increases the risk? This condition is more likely to develop in men who:  Are older than age 69.  Are African-American.  Are obese.  Have a family history of prostate cancer.  Have a family history of breast cancer. What are the signs or symptoms? Symptoms of this condition include:  A need to urinate often.  Weak or interrupted flow of urine.  Trouble starting or stopping urination.  Inability to urinate.  Pain or burning during urination.  Painful ejaculation.  Blood in urine or semen.  Persistent pain or discomfort in the lower back, lower abdomen, hips, or upper thighs.  Trouble getting an erection.  Trouble emptying the bladder all the way. How is this diagnosed? This condition can be diagnosed with:  A digital rectal exam. For this exam, a health care provider inserts a gloved finger into the rectum to feel the prostate gland.  A blood test called a prostate-specific antigen (PSA) test.  An imaging test called transrectal ultrasonography.  A procedure in which a sample of tissue is taken from the prostate and examined under a microscope (prostate biopsy). Once the condition is diagnosed, tests will be done to determine how far the cancer has spread. This is called staging the cancer. Staging may involve imaging tests, such as:  A bone scan.  A CT scan.  A PET scan.  An MRI. The stages of prostate cancer are as follows:  Stage I. At this stage, the cancer is found in the prostate only. The cancer is not visible on imaging tests and it is usually found by accident, such as during a prostate surgery.  Stage II. At this stage, the cancer is more advanced  than it is in stage I, but the cancer has not spread outside the prostate.  Stage III. At this stage, the cancer has spread beyond the outer layer of the prostate to nearby tissues. The cancer may be found in the seminal vesicles, which are near the bladder and the prostate.  Stage IV. At this stage, the cancer has spread other parts of the body, such as the lymph nodes, bones, bladder, rectum, liver, or lungs. How is this treated? Treatment for this condition depends on several factors, including the stage of the cancer, your age, personal preferences, and your overall health. Talk with your health care provider about treatment options that are recommended for you. Common treatments include:  Observation for early stage prostate cancer (active surveillance). This involves having exams, blood tests, and in some cases, more biopsies. For some men, this is the only treatment needed.  Surgery. Types of surgeries include: ? Open surgery. In this surgery, a larger incision is made to remove the prostate. ? A laparoscopic prostatectomy. This is a surgery to remove the prostate and lymph nodes through several, small incisions. It is often referred to as a minimally invasive surgery. ? A robotic prostatectomy. This is a surgery to remove the prostate and lymph nodes with the help of a robotic arm that is controlled by a computer. ? Orchiectomy. This is a surgery to remove the testicles. ? Cryosurgery. This is a surgery to freeze and destroy cancer cells.  Radiation treatment. Types   of radiation treatment include: ? External beam radiation. This type aims beams of radiation from outside the body at the prostate to destroy cancerous cells. ? Brachytherapy. This type uses radioactive needles, seeds, wires, or tubes that are implanted into the prostate gland. Like external beam radiation, brachytherapy destroys cancerous cells. An advantage is that this type of radiation limits the damage to surrounding  tissue and has fewer side effects.  High-intensity, focused ultrasonography. This treatment destroys cancer cells by delivering high-energy ultrasound waves to the cancerous cells.  Chemotherapy medicines. This treatment kills cancer cells or stops them from multiplying.  Hormone treatment. This treatment involves taking medicines that act on one of the male hormones (testosterone): ? By stopping your body from producing testosterone. ? By blocking testosterone from reaching cancer cells. Follow these instructions at home:  Take over-the-counter and prescription medicines only as told by your health care provider.  Maintain a healthy diet.  Get plenty of sleep.  Consider joining a support group for men who have prostate cancer. Meeting with a support group may help you learn to cope with the stress of having cancer.  Keep all follow-up visits as told by your health care provider. This is important.  If you have to go to the hospital, notify your cancer specialist (oncologist).  Treatment for prostate cancer may affect sexual function. Continue to have intimate moments with your partner. This may include touching, holding, hugging, and caressing. Contact a health care provider if:  You have trouble urinating.  You have blood in your urine.  You have pain in your hips, back, or chest. Get help right away if:  You have weakness or numbness in your legs.  You cannot control urination or your bowel movements (incontinence).  You have trouble breathing.  You have sudden chest pain.  You have chills or a fever. Summary  The prostate is a walnut-sized gland that is involved in the production of semen. It is located below a man's bladder, in front of the rectum. Prostate cancer is the abnormal growth of cells in the prostate gland.  Treatment for this condition depends on several factors, including the stage of the cancer, your age, personal preferences, and your overall health.  Talk with your health care provider about treatment options that are recommended for you.  Consider joining a support group for men who have prostate cancer. Meeting with a support group may help you learn to cope with the stress of having cancer. This information is not intended to replace advice given to you by your health care provider. Make sure you discuss any questions you have with your health care provider. Document Revised: 08/30/2017 Document Reviewed: 05/28/2016 Elsevier Patient Education  2020 [employer]  

## 2019-11-04 NOTE — Progress Notes (Signed)
   11/04/2019 4:01 PM   Samaad Delphina Cahill Aug 23, 1951 [id number]  Reason for visit: Discuss prostate biopsy results  HPI: I saw Mr. Hashimoto back in urology clinic to review his prostate biopsy results.  He is a 69 year old African-American male who was found to have an elevated PSA of 6.4 and underwent prostate biopsy on 10/21/2019 showing a 38 g prostate with a PSA density of 0.17, 4/12 cores positive for Gleason score 3+4= 7 disease with max core involvement of 12%.  He denies any urinary symptoms at baseline unless he consumes a large volume of beer.  He denies any erectile dysfunction.  We had a lengthy conversation today about the patient's new diagnosis of prostate cancer.  We reviewed the risk classifications per the AUA guidelines including very low risk, low risk, intermediate risk, and high risk disease, and the need for additional staging imaging with CT and bone scan in patients with unfavorable intermediate risk and high risk disease.  I explained that his life expectancy, clinical stage, Gleason score, PSA, and other co-morbidities influence treatment strategies.  We discussed the roles of active surveillance, radiation therapy, surgical therapy with robotic prostatectomy, and hormone therapy with androgen deprivation.  We discussed that patients urinary symptoms also impact treatment strategy, as patients with severe lower urinary tract symptoms may have significant worsening or even develop urinary retention after undergoing radiation.  In regards to surgery, we discussed robotic prostatectomy +/- lymphadenectomy at length.  The procedure takes 3 to 4 hours, and patient's typically discharge home on post-op day #1.  A Foley catheter is left in place for 7 to 10 days to allow for healing of the vesicourethral anastomosis.  There is a small risk of bleeding, infection, damage to surrounding structures or bowel, hernia, DVT/PE, or serious cardiac or pulmonary complications.  We discussed at  length post-op side effects including erectile dysfunction, and the importance of pre-operative erectile function on long-term outcomes.  Even with a nerve sparing approach, there is an approximately 25% rate of permanent erectile dysfunction.  We also discussed postop urinary incontinence at length.  We expect patients to have stress incontinence post-operatively that will improve over period of weeks to months.  Less than 10% of men will require a pad at 1 year after surgery.  Patients will need to avoid heavy lifting and strenuous activity for 3 to 4 weeks, but most men return to their baseline activity status by 6 weeks.  In summary, Chika Web is a 69 y.o. 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 He would like to pursue radiation.  -Referral placed to Dr. Baruch Gouty for likely external beam radiation.  I do not anticipate he would need ADT with his favorable risk disease and low-volume. -Follow-up for gold seed marker placement if needed, otherwise RTC in 8 months with PSA prior post XRT  A total of 30 minutes were spent face-to-face with the patient, greater than 50% was spent in patient education, counseling, and coordination of care regarding new diagnosis of prostate cancer and treatment options.  Billey Co, [facility] [address], [place], [place] [address] [phone]

## 2019-11-05 ENCOUNTER — Ambulatory Visit: Payer: Medicare HMO | Admitting: Urology

## 2019-11-11 ENCOUNTER — Other Ambulatory Visit: Payer: Self-pay

## 2019-11-12 ENCOUNTER — Encounter: Payer: Self-pay | Admitting: Radiation Oncology

## 2019-11-12 ENCOUNTER — Ambulatory Visit
Admission: RE | Admit: 2019-11-12 | Discharge: 2019-11-12 | Disposition: A | Discharge status: Home / Self Care | Payer: Medicare HMO | Source: Ambulatory Visit | Attending: Radiation Oncology | Admitting: Radiation Oncology

## 2019-11-12 ENCOUNTER — Other Ambulatory Visit: Payer: Self-pay

## 2019-11-12 VITALS — BP 138/87 | HR 75 | Temp 97.3°F | Resp 16 | Wt 207.3 lb

## 2019-11-12 DIAGNOSIS — C61 Malignant neoplasm of prostate: Secondary | ICD-10-CM | POA: Diagnosis not present

## 2019-11-12 NOTE — Consult Note (Signed)
NEW PATIENT EVALUATION  Name: Alexander Cooke  MRN: [id number]  Date:   11/12/2019     DOB: 12/14/1950   This 69 y.o. 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  REFERRING PHYSICIAN: Ronnell Freshwater, NP  CHIEF COMPLAINT:  Chief Complaint  Patient presents with  . Prostate Cancer    Initial consulation    DIAGNOSIS: There were no encounter diagnoses.   PREVIOUS INVESTIGATIONS:  Pathology report reviewed Clinical notes reviewed No films.  To review bone scan not indicated  HPI: Patient is a 69 year old male who presented with an elevated PSA of 6.4.  In January 21 he underwent transrectal ultrasound-guided biopsy showing 4 of 12 cores positive for adenocarcinoma Gleason 7 (3+4).  He is asymptomatic specifically denies urgency frequency or nocturia.  He denies erectile dysfunction.  He has no bowel complaints.  He has been seen by urology and discussed robotic prostatectomy although patient is favoring external beam radiation therapy.  He is seen today for radiation oncology opinion.  PLANNED TREATMENT REGIMEN: Image guided IMRT radiation therapy  PAST MEDICAL HISTORY:  has a past medical history of Arthritis and Hypertension.    PAST SURGICAL HISTORY:  Past Surgical History:  Procedure Laterality Date  . none      FAMILY HISTORY: Family history is unknown by patient.  SOCIAL HISTORY:  reports that he has been smoking. He has never used smokeless tobacco. He reports current alcohol use. He reports current drug use. Drug: Marijuana.  ALLERGIES: Patient has no known allergies.  MEDICATIONS:  Current Outpatient Medications  Medication Sig Dispense Refill  . amLODipine (NORVASC) 5 MG tablet Take 1 tablet (5 mg total) by mouth daily. 90 tablet 3  . losartan (COZAAR) 50 MG tablet Take 1 tablet (50 mg total) by mouth daily. 90 tablet 3   No current  facility-administered medications for this encounter.    ECOG PERFORMANCE STATUS:  0 - Asymptomatic  REVIEW OF SYSTEMS: Patient denies any weight loss, fatigue, weakness, fever, chills or night sweats. Patient denies any loss of vision, blurred vision. Patient denies any ringing  of the ears or hearing loss. No irregular heartbeat. Patient denies heart murmur or history of fainting. Patient denies any chest pain or pain radiating to her upper extremities. Patient denies any shortness of breath, difficulty breathing at night, cough or hemoptysis. Patient denies any swelling in the lower legs. Patient denies any nausea vomiting, vomiting of blood, or coffee ground material in the vomitus. Patient denies any stomach pain. Patient states has had normal bowel movements no significant constipation or diarrhea. Patient denies any dysuria, hematuria or significant nocturia. Patient denies any problems walking, swelling in the joints or loss of balance. Patient denies any skin changes, loss of hair or loss of weight. Patient denies any excessive worrying or anxiety or significant depression. Patient denies any problems with insomnia. Patient denies excessive thirst, polyuria, polydipsia. Patient denies any swollen glands, patient denies easy bruising or easy bleeding. Patient denies any recent infections, allergies or URI. Patient "s visual fields have not changed significantly in recent time.   PHYSICAL EXAM: BP 138/87 (BP Location: Left Arm, Patient Position: Sitting)   Pulse 75   Temp (!) 97.3 F (36.3 C) (Tympanic)   Resp 16   Wt 207 lb 4.8 oz (94 kg)   BMI 32.47 kg/m  Well-developed well-nourished patient in NAD. HEENT reveals  PERLA, EOMI, discs not visualized.  Oral cavity is clear. No oral mucosal lesions are identified. Neck is clear without evidence of cervical or supraclavicular adenopathy. Lungs are clear to A&P. Cardiac examination is essentially unremarkable with regular rate and rhythm without  murmur rub or thrill. Abdomen is benign with no organomegaly or masses noted. Motor sensory and DTR levels are equal and symmetric in the upper and lower extremities. Cranial nerves II through XII are grossly intact. Proprioception is intact. No peripheral adenopathy or edema is identified. No motor or sensory levels are noted. Crude visual fields are within normal range.  LABORATORY DATA: Pathology report reviewed    RADIOLOGY RESULTS: Bone scan not indicated   IMPRESSION: Stage IIa Gleason 7 (3+4) adenocarcinoma the prostate presenting with a PSA of 6.4  PLAN: At this time patient is interested in external beam IMRT radiation therapy treatments risks and benefits of robotic prostatectomy versus I-125 interstitial implant versus external beam treatment were all discussed in detail with the patient.  I have asked urology to place fiducial markers in his prostate for daily image guided treatment.  Would take his prostate 8000 cGy in 8 weeks.  Risks and benefits of treatment including increased lower urinary tract symptoms diarrhea fatigue skin reaction all were discussed in detail with the patient.  His wife was present during our consultation.  Patient comprehends my treatment plan well.  Once markers are placed I have scheduled him for CT simulation.  Patient comprehends her treatment plan well.  I would like to take this opportunity to thank you for allowing me to participate in the care of your patient.Noreene Filbert, MD

## 2019-11-18 ENCOUNTER — Ambulatory Visit: Payer: Medicare HMO | Admitting: Urology

## 2019-11-26 ENCOUNTER — Ambulatory Visit (INDEPENDENT_AMBULATORY_CARE_PROVIDER_SITE_OTHER): Payer: Medicare HMO | Admitting: Urology

## 2019-11-26 ENCOUNTER — Encounter: Payer: Self-pay | Admitting: Urology

## 2019-11-26 ENCOUNTER — Other Ambulatory Visit: Payer: Self-pay

## 2019-11-26 VITALS — BP 134/83 | HR 65 | Ht 67.0 in | Wt 197.0 lb

## 2019-11-26 DIAGNOSIS — C61 Malignant neoplasm of prostate: Secondary | ICD-10-CM | POA: Diagnosis not present

## 2019-11-26 MED ORDER — LEVOFLOXACIN 500 MG PO TABS
500.0000 mg | ORAL_TABLET | Freq: Once | ORAL | Status: AC
  Administered 2019-11-26: 500 mg via ORAL

## 2019-11-26 MED ORDER — GENTAMICIN SULFATE 40 MG/ML IJ SOLN
80.0000 mg | Freq: Once | INTRAMUSCULAR | Status: AC
  Administered 2019-11-26: 80 mg via INTRAMUSCULAR

## 2019-11-26 NOTE — Progress Notes (Signed)
   11/26/19  Indication: Favorable intermediate risk prostate cancer  Prostate gold seed marker placement  Informed consent was obtained, and we discussed the risks of bleeding and infection/sepsis. A time out was performed to ensure correct patient identity.  Pre-Procedure: - Gentamicin and levaquin given for antibiotic prophylaxis  Procedure: -Ultrasound guided placement of gold seed markers at both lateral bases and at the apex midline  Post-Procedure: - Patient tolerated the procedure well - He was counseled to seek immediate medical attention if experiences significant bleeding, fevers, or severe pain -Follow-up with Dr. Baruch Gouty for XRT  Assessment/ Plan: Will follow up in 8 months with PSA prior after completing radiation Defer ADT with his favorable risk disease and low-volume  Nickolas Madrid, MD 11/26/2019

## 2019-11-27 ENCOUNTER — Other Ambulatory Visit: Payer: Self-pay

## 2019-11-30 ENCOUNTER — Other Ambulatory Visit: Payer: Self-pay

## 2019-11-30 ENCOUNTER — Ambulatory Visit
Admission: RE | Admit: 2019-11-30 | Discharge: 2019-11-30 | Disposition: A | Discharge status: Home / Self Care | Payer: Medicare HMO | Source: Ambulatory Visit | Attending: Radiation Oncology | Admitting: Radiation Oncology

## 2019-11-30 DIAGNOSIS — C61 Malignant neoplasm of prostate: Secondary | ICD-10-CM | POA: Insufficient documentation

## 2019-11-30 DIAGNOSIS — Z510 Encounter for antineoplastic radiation therapy: Secondary | ICD-10-CM | POA: Insufficient documentation

## 2019-12-02 ENCOUNTER — Other Ambulatory Visit: Payer: Self-pay | Admitting: *Deleted

## 2019-12-02 DIAGNOSIS — C61 Malignant neoplasm of prostate: Secondary | ICD-10-CM

## 2019-12-04 DIAGNOSIS — C61 Malignant neoplasm of prostate: Secondary | ICD-10-CM | POA: Diagnosis not present

## 2019-12-04 DIAGNOSIS — Z510 Encounter for antineoplastic radiation therapy: Secondary | ICD-10-CM | POA: Diagnosis not present

## 2019-12-07 ENCOUNTER — Ambulatory Visit: Admission: RE | Admit: 2019-12-07 | Payer: Medicare HMO | Source: Ambulatory Visit

## 2019-12-07 ENCOUNTER — Other Ambulatory Visit: Payer: Self-pay

## 2019-12-08 ENCOUNTER — Ambulatory Visit
Admission: RE | Admit: 2019-12-08 | Discharge: 2019-12-08 | Disposition: A | Discharge status: Home / Self Care | Payer: Medicare HMO | Source: Ambulatory Visit | Attending: Radiation Oncology | Admitting: Radiation Oncology

## 2019-12-08 ENCOUNTER — Other Ambulatory Visit: Payer: Self-pay

## 2019-12-08 DIAGNOSIS — Z510 Encounter for antineoplastic radiation therapy: Secondary | ICD-10-CM | POA: Diagnosis not present

## 2019-12-08 DIAGNOSIS — C61 Malignant neoplasm of prostate: Secondary | ICD-10-CM | POA: Diagnosis not present

## 2019-12-09 ENCOUNTER — Ambulatory Visit
Admission: RE | Admit: 2019-12-09 | Discharge: 2019-12-09 | Disposition: A | Discharge status: Home / Self Care | Payer: Medicare HMO | Source: Ambulatory Visit | Attending: Radiation Oncology | Admitting: Radiation Oncology

## 2019-12-09 DIAGNOSIS — C61 Malignant neoplasm of prostate: Secondary | ICD-10-CM | POA: Diagnosis not present

## 2019-12-09 DIAGNOSIS — Z510 Encounter for antineoplastic radiation therapy: Secondary | ICD-10-CM | POA: Diagnosis not present

## 2019-12-10 ENCOUNTER — Ambulatory Visit
Admission: RE | Admit: 2019-12-10 | Discharge: 2019-12-10 | Disposition: A | Discharge status: Home / Self Care | Payer: Medicare HMO | Source: Ambulatory Visit | Attending: Radiation Oncology | Admitting: Radiation Oncology

## 2019-12-10 DIAGNOSIS — C61 Malignant neoplasm of prostate: Secondary | ICD-10-CM | POA: Diagnosis not present

## 2019-12-10 DIAGNOSIS — Z510 Encounter for antineoplastic radiation therapy: Secondary | ICD-10-CM | POA: Diagnosis not present

## 2019-12-11 ENCOUNTER — Ambulatory Visit
Admission: RE | Admit: 2019-12-11 | Discharge: 2019-12-11 | Disposition: A | Discharge status: Home / Self Care | Payer: Medicare HMO | Source: Ambulatory Visit | Attending: Radiation Oncology | Admitting: Radiation Oncology

## 2019-12-11 DIAGNOSIS — Z510 Encounter for antineoplastic radiation therapy: Secondary | ICD-10-CM | POA: Diagnosis not present

## 2019-12-11 DIAGNOSIS — C61 Malignant neoplasm of prostate: Secondary | ICD-10-CM | POA: Diagnosis not present

## 2019-12-14 ENCOUNTER — Ambulatory Visit
Admission: RE | Admit: 2019-12-14 | Discharge: 2019-12-14 | Disposition: A | Discharge status: Home / Self Care | Payer: Medicare HMO | Source: Ambulatory Visit | Attending: Radiation Oncology | Admitting: Radiation Oncology

## 2019-12-14 DIAGNOSIS — C61 Malignant neoplasm of prostate: Secondary | ICD-10-CM | POA: Diagnosis not present

## 2019-12-14 DIAGNOSIS — Z510 Encounter for antineoplastic radiation therapy: Secondary | ICD-10-CM | POA: Diagnosis not present

## 2019-12-15 ENCOUNTER — Ambulatory Visit
Admission: RE | Admit: 2019-12-15 | Discharge: 2019-12-15 | Disposition: A | Discharge status: Home / Self Care | Payer: Medicare HMO | Source: Ambulatory Visit | Attending: Radiation Oncology | Admitting: Radiation Oncology

## 2019-12-15 DIAGNOSIS — Z510 Encounter for antineoplastic radiation therapy: Secondary | ICD-10-CM | POA: Diagnosis not present

## 2019-12-15 DIAGNOSIS — C61 Malignant neoplasm of prostate: Secondary | ICD-10-CM | POA: Diagnosis not present

## 2019-12-16 ENCOUNTER — Ambulatory Visit
Admission: RE | Admit: 2019-12-16 | Discharge: 2019-12-16 | Disposition: A | Discharge status: Home / Self Care | Payer: Medicare HMO | Source: Ambulatory Visit | Attending: Radiation Oncology | Admitting: Radiation Oncology

## 2019-12-16 DIAGNOSIS — C61 Malignant neoplasm of prostate: Secondary | ICD-10-CM | POA: Diagnosis not present

## 2019-12-16 DIAGNOSIS — Z510 Encounter for antineoplastic radiation therapy: Secondary | ICD-10-CM | POA: Diagnosis not present

## 2019-12-17 ENCOUNTER — Ambulatory Visit
Admission: RE | Admit: 2019-12-17 | Discharge: 2019-12-17 | Disposition: A | Discharge status: Home / Self Care | Payer: Medicare HMO | Source: Ambulatory Visit | Attending: Radiation Oncology | Admitting: Radiation Oncology

## 2019-12-17 DIAGNOSIS — C61 Malignant neoplasm of prostate: Secondary | ICD-10-CM | POA: Insufficient documentation

## 2019-12-17 DIAGNOSIS — Z510 Encounter for antineoplastic radiation therapy: Secondary | ICD-10-CM | POA: Insufficient documentation

## 2019-12-18 ENCOUNTER — Ambulatory Visit
Admission: RE | Admit: 2019-12-18 | Discharge: 2019-12-18 | Disposition: A | Discharge status: Home / Self Care | Payer: Medicare HMO | Source: Ambulatory Visit | Attending: Radiation Oncology | Admitting: Radiation Oncology

## 2019-12-18 DIAGNOSIS — C61 Malignant neoplasm of prostate: Secondary | ICD-10-CM | POA: Diagnosis not present

## 2019-12-18 DIAGNOSIS — Z510 Encounter for antineoplastic radiation therapy: Secondary | ICD-10-CM | POA: Diagnosis not present

## 2019-12-21 ENCOUNTER — Ambulatory Visit
Admission: RE | Admit: 2019-12-21 | Discharge: 2019-12-21 | Disposition: A | Discharge status: Home / Self Care | Payer: Medicare HMO | Source: Ambulatory Visit | Attending: Radiation Oncology | Admitting: Radiation Oncology

## 2019-12-21 DIAGNOSIS — Z510 Encounter for antineoplastic radiation therapy: Secondary | ICD-10-CM | POA: Diagnosis not present

## 2019-12-21 DIAGNOSIS — C61 Malignant neoplasm of prostate: Secondary | ICD-10-CM | POA: Diagnosis not present

## 2019-12-22 ENCOUNTER — Ambulatory Visit
Admission: RE | Admit: 2019-12-22 | Discharge: 2019-12-22 | Disposition: A | Discharge status: Home / Self Care | Payer: Medicare HMO | Source: Ambulatory Visit | Attending: Radiation Oncology | Admitting: Radiation Oncology

## 2019-12-22 DIAGNOSIS — C61 Malignant neoplasm of prostate: Secondary | ICD-10-CM | POA: Diagnosis not present

## 2019-12-22 DIAGNOSIS — Z510 Encounter for antineoplastic radiation therapy: Secondary | ICD-10-CM | POA: Diagnosis not present

## 2019-12-23 ENCOUNTER — Ambulatory Visit
Admission: RE | Admit: 2019-12-23 | Discharge: 2019-12-23 | Disposition: A | Discharge status: Home / Self Care | Payer: Medicare HMO | Source: Ambulatory Visit | Attending: Radiation Oncology | Admitting: Radiation Oncology

## 2019-12-23 ENCOUNTER — Other Ambulatory Visit: Payer: Self-pay

## 2019-12-23 ENCOUNTER — Inpatient Hospital Stay: Payer: Medicare HMO | Attending: Radiation Oncology

## 2019-12-23 DIAGNOSIS — C61 Malignant neoplasm of prostate: Secondary | ICD-10-CM

## 2019-12-23 DIAGNOSIS — Z510 Encounter for antineoplastic radiation therapy: Secondary | ICD-10-CM | POA: Diagnosis not present

## 2019-12-23 LAB — CBC
HCT: 45.4 % (ref 39.0–52.0)
Hemoglobin: 15.5 g/dL (ref 13.0–17.0)
MCH: 31 pg (ref 26.0–34.0)
MCHC: 34.1 g/dL (ref 30.0–36.0)
MCV: 90.8 fL (ref 80.0–100.0)
Platelets: 206 10*3/uL (ref 150–400)
RBC: 5 MIL/uL (ref 4.22–5.81)
RDW: 13 % (ref 11.5–15.5)
WBC: 7.4 10*3/uL (ref 4.0–10.5)
nRBC: 0 % (ref 0.0–0.2)

## 2019-12-24 ENCOUNTER — Ambulatory Visit
Admission: RE | Admit: 2019-12-24 | Discharge: 2019-12-24 | Disposition: A | Discharge status: Home / Self Care | Payer: Medicare HMO | Source: Ambulatory Visit | Attending: Radiation Oncology | Admitting: Radiation Oncology

## 2019-12-24 DIAGNOSIS — Z510 Encounter for antineoplastic radiation therapy: Secondary | ICD-10-CM | POA: Diagnosis not present

## 2019-12-24 DIAGNOSIS — C61 Malignant neoplasm of prostate: Secondary | ICD-10-CM | POA: Diagnosis not present

## 2019-12-25 ENCOUNTER — Ambulatory Visit
Admission: RE | Admit: 2019-12-25 | Discharge: 2019-12-25 | Disposition: A | Discharge status: Home / Self Care | Payer: Medicare HMO | Source: Ambulatory Visit | Attending: Radiation Oncology | Admitting: Radiation Oncology

## 2019-12-25 DIAGNOSIS — C61 Malignant neoplasm of prostate: Secondary | ICD-10-CM | POA: Diagnosis not present

## 2019-12-25 DIAGNOSIS — Z510 Encounter for antineoplastic radiation therapy: Secondary | ICD-10-CM | POA: Diagnosis not present

## 2019-12-28 ENCOUNTER — Ambulatory Visit
Admission: RE | Admit: 2019-12-28 | Discharge: 2019-12-28 | Disposition: A | Discharge status: Home / Self Care | Payer: Medicare HMO | Source: Ambulatory Visit | Attending: Radiation Oncology | Admitting: Radiation Oncology

## 2019-12-28 DIAGNOSIS — C61 Malignant neoplasm of prostate: Secondary | ICD-10-CM | POA: Diagnosis not present

## 2019-12-28 DIAGNOSIS — Z510 Encounter for antineoplastic radiation therapy: Secondary | ICD-10-CM | POA: Diagnosis not present

## 2019-12-29 ENCOUNTER — Ambulatory Visit
Admission: RE | Admit: 2019-12-29 | Discharge: 2019-12-29 | Disposition: A | Discharge status: Home / Self Care | Payer: Medicare HMO | Source: Ambulatory Visit | Attending: Radiation Oncology | Admitting: Radiation Oncology

## 2019-12-29 ENCOUNTER — Other Ambulatory Visit: Payer: Self-pay | Admitting: *Deleted

## 2019-12-29 DIAGNOSIS — Z510 Encounter for antineoplastic radiation therapy: Secondary | ICD-10-CM | POA: Diagnosis not present

## 2019-12-29 DIAGNOSIS — C61 Malignant neoplasm of prostate: Secondary | ICD-10-CM | POA: Diagnosis not present

## 2019-12-29 MED ORDER — COLCHICINE-PROBENECID 0.5-500 MG PO TABS: 1.0000 | ORAL_TABLET | Freq: Every day | ORAL | 1 refills | Status: DC

## 2019-12-30 ENCOUNTER — Ambulatory Visit
Admission: RE | Admit: 2019-12-30 | Discharge: 2019-12-30 | Disposition: A | Discharge status: Home / Self Care | Payer: Medicare HMO | Source: Ambulatory Visit | Attending: Radiation Oncology | Admitting: Radiation Oncology

## 2019-12-30 DIAGNOSIS — Z510 Encounter for antineoplastic radiation therapy: Secondary | ICD-10-CM | POA: Diagnosis not present

## 2019-12-30 DIAGNOSIS — C61 Malignant neoplasm of prostate: Secondary | ICD-10-CM | POA: Diagnosis not present

## 2019-12-31 ENCOUNTER — Ambulatory Visit
Admission: RE | Admit: 2019-12-31 | Discharge: 2019-12-31 | Disposition: A | Discharge status: Home / Self Care | Payer: Medicare HMO | Source: Ambulatory Visit | Attending: Radiation Oncology | Admitting: Radiation Oncology

## 2019-12-31 ENCOUNTER — Ambulatory Visit: Payer: Medicare HMO

## 2019-12-31 DIAGNOSIS — Z510 Encounter for antineoplastic radiation therapy: Secondary | ICD-10-CM | POA: Diagnosis not present

## 2019-12-31 DIAGNOSIS — C61 Malignant neoplasm of prostate: Secondary | ICD-10-CM | POA: Insufficient documentation

## 2020-01-01 ENCOUNTER — Ambulatory Visit: Payer: Medicare HMO | Attending: Internal Medicine

## 2020-01-01 ENCOUNTER — Ambulatory Visit
Admission: RE | Admit: 2020-01-01 | Discharge: 2020-01-01 | Disposition: A | Discharge status: Home / Self Care | Payer: Medicare HMO | Source: Ambulatory Visit | Attending: Radiation Oncology | Admitting: Radiation Oncology

## 2020-01-01 DIAGNOSIS — Z510 Encounter for antineoplastic radiation therapy: Secondary | ICD-10-CM | POA: Diagnosis not present

## 2020-01-01 DIAGNOSIS — C61 Malignant neoplasm of prostate: Secondary | ICD-10-CM | POA: Diagnosis not present

## 2020-01-01 DIAGNOSIS — Z23 Encounter for immunization: Secondary | ICD-10-CM

## 2020-01-01 NOTE — Progress Notes (Signed)
   Covid-19 Vaccination Clinic  Name:  Baron Steinback    MRN: [id number] DOB: 28-Dec-1950  01/01/2020  Mr. Sardinas was observed post Covid-19 immunization for 15 minutes without incident. He was provided with Vaccine Information Sheet and instruction to access the V-Safe system.   Mr. Wittkopp was instructed to call 911 with any severe reactions post vaccine: Marland Kitchen Difficulty breathing  . Swelling of face and throat  . A fast heartbeat  . A bad rash all over body  . Dizziness and weakness   Immunizations Administered    Name Date Dose VIS Date Route   Pfizer COVID-19 Vaccine 01/01/2020  9:02 AM 0.3 mL 09/11/2019 Intramuscular   Manufacturer: [place]   Lot: [id number]   [place]: [id number]

## 2020-01-04 ENCOUNTER — Ambulatory Visit
Admission: RE | Admit: 2020-01-04 | Discharge: 2020-01-04 | Disposition: A | Discharge status: Home / Self Care | Payer: Medicare HMO | Source: Ambulatory Visit | Attending: Radiation Oncology | Admitting: Radiation Oncology

## 2020-01-04 DIAGNOSIS — Z510 Encounter for antineoplastic radiation therapy: Secondary | ICD-10-CM | POA: Diagnosis not present

## 2020-01-04 DIAGNOSIS — C61 Malignant neoplasm of prostate: Secondary | ICD-10-CM | POA: Diagnosis not present

## 2020-01-05 ENCOUNTER — Ambulatory Visit
Admission: RE | Admit: 2020-01-05 | Discharge: 2020-01-05 | Disposition: A | Discharge status: Home / Self Care | Payer: Medicare HMO | Source: Ambulatory Visit | Attending: Radiation Oncology | Admitting: Radiation Oncology

## 2020-01-05 DIAGNOSIS — Z510 Encounter for antineoplastic radiation therapy: Secondary | ICD-10-CM | POA: Diagnosis not present

## 2020-01-05 DIAGNOSIS — C61 Malignant neoplasm of prostate: Secondary | ICD-10-CM | POA: Diagnosis not present

## 2020-01-06 ENCOUNTER — Ambulatory Visit
Admission: RE | Admit: 2020-01-06 | Discharge: 2020-01-06 | Disposition: A | Discharge status: Home / Self Care | Payer: Medicare HMO | Source: Ambulatory Visit | Attending: Radiation Oncology | Admitting: Radiation Oncology

## 2020-01-06 ENCOUNTER — Other Ambulatory Visit: Payer: Self-pay

## 2020-01-06 ENCOUNTER — Inpatient Hospital Stay: Payer: Medicare HMO | Attending: Radiation Oncology

## 2020-01-06 DIAGNOSIS — C61 Malignant neoplasm of prostate: Secondary | ICD-10-CM | POA: Insufficient documentation

## 2020-01-06 DIAGNOSIS — Z510 Encounter for antineoplastic radiation therapy: Secondary | ICD-10-CM | POA: Diagnosis not present

## 2020-01-06 LAB — CBC
HCT: 45.7 % (ref 39.0–52.0)
Hemoglobin: 15.4 g/dL (ref 13.0–17.0)
MCH: 31.3 pg (ref 26.0–34.0)
MCHC: 33.7 g/dL (ref 30.0–36.0)
MCV: 92.9 fL (ref 80.0–100.0)
Platelets: 170 10*3/uL (ref 150–400)
RBC: 4.92 MIL/uL (ref 4.22–5.81)
RDW: 13 % (ref 11.5–15.5)
WBC: 4.8 10*3/uL (ref 4.0–10.5)
nRBC: 0 % (ref 0.0–0.2)

## 2020-01-07 ENCOUNTER — Ambulatory Visit
Admission: RE | Admit: 2020-01-07 | Discharge: 2020-01-07 | Disposition: A | Discharge status: Home / Self Care | Payer: Medicare HMO | Source: Ambulatory Visit | Attending: Radiation Oncology | Admitting: Radiation Oncology

## 2020-01-07 DIAGNOSIS — C61 Malignant neoplasm of prostate: Secondary | ICD-10-CM | POA: Diagnosis not present

## 2020-01-07 DIAGNOSIS — Z510 Encounter for antineoplastic radiation therapy: Secondary | ICD-10-CM | POA: Diagnosis not present

## 2020-01-08 ENCOUNTER — Ambulatory Visit
Admission: RE | Admit: 2020-01-08 | Discharge: 2020-01-08 | Disposition: A | Discharge status: Home / Self Care | Payer: Medicare HMO | Source: Ambulatory Visit | Attending: Radiation Oncology | Admitting: Radiation Oncology

## 2020-01-08 DIAGNOSIS — C61 Malignant neoplasm of prostate: Secondary | ICD-10-CM | POA: Diagnosis not present

## 2020-01-08 DIAGNOSIS — Z510 Encounter for antineoplastic radiation therapy: Secondary | ICD-10-CM | POA: Diagnosis not present

## 2020-01-11 ENCOUNTER — Ambulatory Visit
Admission: RE | Admit: 2020-01-11 | Discharge: 2020-01-11 | Disposition: A | Discharge status: Home / Self Care | Payer: Medicare HMO | Source: Ambulatory Visit | Attending: Radiation Oncology | Admitting: Radiation Oncology

## 2020-01-11 DIAGNOSIS — Z510 Encounter for antineoplastic radiation therapy: Secondary | ICD-10-CM | POA: Diagnosis not present

## 2020-01-11 DIAGNOSIS — C61 Malignant neoplasm of prostate: Secondary | ICD-10-CM | POA: Diagnosis not present

## 2020-01-12 ENCOUNTER — Ambulatory Visit
Admission: RE | Admit: 2020-01-12 | Discharge: 2020-01-12 | Disposition: A | Discharge status: Home / Self Care | Payer: Medicare HMO | Source: Ambulatory Visit | Attending: Radiation Oncology | Admitting: Radiation Oncology

## 2020-01-12 DIAGNOSIS — Z510 Encounter for antineoplastic radiation therapy: Secondary | ICD-10-CM | POA: Diagnosis not present

## 2020-01-12 DIAGNOSIS — C61 Malignant neoplasm of prostate: Secondary | ICD-10-CM | POA: Diagnosis not present

## 2020-01-13 ENCOUNTER — Ambulatory Visit
Admission: RE | Admit: 2020-01-13 | Discharge: 2020-01-13 | Disposition: A | Discharge status: Home / Self Care | Payer: Medicare HMO | Source: Ambulatory Visit | Attending: Radiation Oncology | Admitting: Radiation Oncology

## 2020-01-13 DIAGNOSIS — Z510 Encounter for antineoplastic radiation therapy: Secondary | ICD-10-CM | POA: Diagnosis not present

## 2020-01-13 DIAGNOSIS — C61 Malignant neoplasm of prostate: Secondary | ICD-10-CM | POA: Diagnosis not present

## 2020-01-14 ENCOUNTER — Ambulatory Visit
Admission: RE | Admit: 2020-01-14 | Discharge: 2020-01-14 | Disposition: A | Discharge status: Home / Self Care | Payer: Medicare HMO | Source: Ambulatory Visit | Attending: Radiation Oncology | Admitting: Radiation Oncology

## 2020-01-14 DIAGNOSIS — C61 Malignant neoplasm of prostate: Secondary | ICD-10-CM | POA: Diagnosis not present

## 2020-01-14 DIAGNOSIS — Z510 Encounter for antineoplastic radiation therapy: Secondary | ICD-10-CM | POA: Diagnosis not present

## 2020-01-15 ENCOUNTER — Ambulatory Visit
Admission: RE | Admit: 2020-01-15 | Discharge: 2020-01-15 | Disposition: A | Discharge status: Home / Self Care | Payer: Medicare HMO | Source: Ambulatory Visit | Attending: Radiation Oncology | Admitting: Radiation Oncology

## 2020-01-15 DIAGNOSIS — Z510 Encounter for antineoplastic radiation therapy: Secondary | ICD-10-CM | POA: Diagnosis not present

## 2020-01-15 DIAGNOSIS — C61 Malignant neoplasm of prostate: Secondary | ICD-10-CM | POA: Diagnosis not present

## 2020-01-18 ENCOUNTER — Ambulatory Visit
Admission: RE | Admit: 2020-01-18 | Discharge: 2020-01-18 | Disposition: A | Discharge status: Home / Self Care | Payer: Medicare HMO | Source: Ambulatory Visit | Attending: Radiation Oncology | Admitting: Radiation Oncology

## 2020-01-18 DIAGNOSIS — Z510 Encounter for antineoplastic radiation therapy: Secondary | ICD-10-CM | POA: Diagnosis not present

## 2020-01-18 DIAGNOSIS — C61 Malignant neoplasm of prostate: Secondary | ICD-10-CM | POA: Diagnosis not present

## 2020-01-19 ENCOUNTER — Ambulatory Visit
Admission: RE | Admit: 2020-01-19 | Discharge: 2020-01-19 | Disposition: A | Discharge status: Home / Self Care | Payer: Medicare HMO | Source: Ambulatory Visit | Attending: Radiation Oncology | Admitting: Radiation Oncology

## 2020-01-19 DIAGNOSIS — Z510 Encounter for antineoplastic radiation therapy: Secondary | ICD-10-CM | POA: Diagnosis not present

## 2020-01-19 DIAGNOSIS — C61 Malignant neoplasm of prostate: Secondary | ICD-10-CM | POA: Diagnosis not present

## 2020-01-20 ENCOUNTER — Inpatient Hospital Stay: Payer: Medicare HMO

## 2020-01-20 ENCOUNTER — Ambulatory Visit
Admission: RE | Admit: 2020-01-20 | Discharge: 2020-01-20 | Disposition: A | Discharge status: Home / Self Care | Payer: Medicare HMO | Source: Ambulatory Visit | Attending: Radiation Oncology | Admitting: Radiation Oncology

## 2020-01-20 DIAGNOSIS — C61 Malignant neoplasm of prostate: Secondary | ICD-10-CM | POA: Diagnosis not present

## 2020-01-20 DIAGNOSIS — Z510 Encounter for antineoplastic radiation therapy: Secondary | ICD-10-CM | POA: Diagnosis not present

## 2020-01-21 ENCOUNTER — Other Ambulatory Visit: Payer: Self-pay | Admitting: Radiation Oncology

## 2020-01-21 ENCOUNTER — Ambulatory Visit
Admission: RE | Admit: 2020-01-21 | Discharge: 2020-01-21 | Disposition: A | Discharge status: Home / Self Care | Payer: Medicare HMO | Source: Ambulatory Visit | Attending: Radiation Oncology | Admitting: Radiation Oncology

## 2020-01-21 DIAGNOSIS — Z510 Encounter for antineoplastic radiation therapy: Secondary | ICD-10-CM | POA: Diagnosis not present

## 2020-01-21 DIAGNOSIS — C61 Malignant neoplasm of prostate: Secondary | ICD-10-CM | POA: Diagnosis not present

## 2020-01-22 ENCOUNTER — Ambulatory Visit
Admission: RE | Admit: 2020-01-22 | Discharge: 2020-01-22 | Disposition: A | Discharge status: Home / Self Care | Payer: Medicare HMO | Source: Ambulatory Visit | Attending: Radiation Oncology | Admitting: Radiation Oncology

## 2020-01-22 DIAGNOSIS — C61 Malignant neoplasm of prostate: Secondary | ICD-10-CM | POA: Diagnosis not present

## 2020-01-22 DIAGNOSIS — Z510 Encounter for antineoplastic radiation therapy: Secondary | ICD-10-CM | POA: Diagnosis not present

## 2020-01-25 ENCOUNTER — Ambulatory Visit
Admission: RE | Admit: 2020-01-25 | Discharge: 2020-01-25 | Disposition: A | Discharge status: Home / Self Care | Payer: Medicare HMO | Source: Ambulatory Visit | Attending: Radiation Oncology | Admitting: Radiation Oncology

## 2020-01-25 DIAGNOSIS — Z510 Encounter for antineoplastic radiation therapy: Secondary | ICD-10-CM | POA: Diagnosis not present

## 2020-01-25 DIAGNOSIS — C61 Malignant neoplasm of prostate: Secondary | ICD-10-CM | POA: Diagnosis not present

## 2020-01-26 ENCOUNTER — Ambulatory Visit
Admission: RE | Admit: 2020-01-26 | Discharge: 2020-01-26 | Disposition: A | Discharge status: Home / Self Care | Payer: Medicare HMO | Source: Ambulatory Visit | Attending: Radiation Oncology | Admitting: Radiation Oncology

## 2020-01-26 DIAGNOSIS — Z510 Encounter for antineoplastic radiation therapy: Secondary | ICD-10-CM | POA: Diagnosis not present

## 2020-01-26 DIAGNOSIS — C61 Malignant neoplasm of prostate: Secondary | ICD-10-CM | POA: Diagnosis not present

## 2020-01-27 ENCOUNTER — Other Ambulatory Visit: Payer: Self-pay

## 2020-01-27 ENCOUNTER — Ambulatory Visit
Admission: RE | Admit: 2020-01-27 | Discharge: 2020-01-27 | Disposition: A | Discharge status: Home / Self Care | Payer: Medicare HMO | Source: Ambulatory Visit | Attending: Radiation Oncology | Admitting: Radiation Oncology

## 2020-01-27 ENCOUNTER — Ambulatory Visit: Payer: Medicare HMO | Attending: Internal Medicine

## 2020-01-27 DIAGNOSIS — C61 Malignant neoplasm of prostate: Secondary | ICD-10-CM | POA: Diagnosis not present

## 2020-01-27 DIAGNOSIS — Z23 Encounter for immunization: Secondary | ICD-10-CM

## 2020-01-27 DIAGNOSIS — Z510 Encounter for antineoplastic radiation therapy: Secondary | ICD-10-CM | POA: Diagnosis not present

## 2020-01-27 NOTE — Progress Notes (Signed)
   Covid-19 Vaccination Clinic  Name:  Alexander Cooke    MRN: [id number] DOB: 1951/08/30  01/27/2020  Mr. Alexander Cooke was observed post Covid-19 immunization for 15 minutes without incident. He was provided with Vaccine Information Sheet and instruction to access the V-Safe system.   Mr. Alexander Cooke was instructed to call 911 with any severe reactions post vaccine: Marland Kitchen Difficulty breathing  . Swelling of face and throat  . A fast heartbeat  . A bad rash all over body  . Dizziness and weakness   Immunizations Administered    Name Date Dose VIS Date Route   Pfizer COVID-19 Vaccine 01/27/2020  8:57 AM 0.3 mL 11/25/2018 Intramuscular   Manufacturer: [place]   Lot: [id number]   [place]: [id number]

## 2020-01-28 ENCOUNTER — Ambulatory Visit
Admission: RE | Admit: 2020-01-28 | Discharge: 2020-01-28 | Disposition: A | Discharge status: Home / Self Care | Payer: Medicare HMO | Source: Ambulatory Visit | Attending: Radiation Oncology | Admitting: Radiation Oncology

## 2020-01-28 DIAGNOSIS — Z510 Encounter for antineoplastic radiation therapy: Secondary | ICD-10-CM | POA: Diagnosis not present

## 2020-01-28 DIAGNOSIS — C61 Malignant neoplasm of prostate: Secondary | ICD-10-CM | POA: Diagnosis not present

## 2020-01-29 ENCOUNTER — Ambulatory Visit
Admission: RE | Admit: 2020-01-29 | Discharge: 2020-01-29 | Disposition: A | Discharge status: Home / Self Care | Payer: Medicare HMO | Source: Ambulatory Visit | Attending: Radiation Oncology | Admitting: Radiation Oncology

## 2020-01-29 DIAGNOSIS — Z510 Encounter for antineoplastic radiation therapy: Secondary | ICD-10-CM | POA: Diagnosis not present

## 2020-01-29 DIAGNOSIS — C61 Malignant neoplasm of prostate: Secondary | ICD-10-CM | POA: Diagnosis not present

## 2020-02-01 ENCOUNTER — Ambulatory Visit
Admission: RE | Admit: 2020-02-01 | Discharge: 2020-02-01 | Disposition: A | Discharge status: Home / Self Care | Payer: Medicare HMO | Source: Ambulatory Visit | Attending: Radiation Oncology | Admitting: Radiation Oncology

## 2020-02-01 DIAGNOSIS — C61 Malignant neoplasm of prostate: Secondary | ICD-10-CM | POA: Diagnosis not present

## 2020-02-01 DIAGNOSIS — Z510 Encounter for antineoplastic radiation therapy: Secondary | ICD-10-CM | POA: Insufficient documentation

## 2020-02-22 ENCOUNTER — Telehealth: Payer: Self-pay | Admitting: *Deleted

## 2020-02-22 ENCOUNTER — Other Ambulatory Visit: Payer: Self-pay | Admitting: *Deleted

## 2020-02-22 MED ORDER — CEPHALEXIN 500 MG PO CAPS: 500.0000 mg | ORAL_CAPSULE | Freq: Two times a day (BID) | ORAL | 0 refills | Status: DC

## 2020-02-22 NOTE — Telephone Encounter (Signed)
Spoke with patient, he is having a skin issue post radiation.  Patient came in to see Dr. Baruch Gouty.

## 2020-02-22 NOTE — Telephone Encounter (Signed)
Patient called stating he is having a prostate problem and would like a return call. I attempted and got message that he does not have voice mail set up

## 2020-03-04 ENCOUNTER — Encounter: Payer: Self-pay | Admitting: Radiation Oncology

## 2020-03-04 ENCOUNTER — Other Ambulatory Visit: Payer: Self-pay

## 2020-03-07 ENCOUNTER — Other Ambulatory Visit: Payer: Self-pay | Admitting: *Deleted

## 2020-03-07 ENCOUNTER — Ambulatory Visit
Admission: RE | Admit: 2020-03-07 | Discharge: 2020-03-07 | Disposition: A | Discharge status: Home / Self Care | Payer: Medicare HMO | Source: Ambulatory Visit | Attending: Radiation Oncology | Admitting: Radiation Oncology

## 2020-03-07 ENCOUNTER — Encounter: Payer: Self-pay | Admitting: Radiation Oncology

## 2020-03-07 ENCOUNTER — Other Ambulatory Visit: Payer: Self-pay

## 2020-03-07 VITALS — BP 134/94 | HR 70 | Temp 97.6°F | Resp 20 | Wt 194.3 lb

## 2020-03-07 DIAGNOSIS — Z923 Personal history of irradiation: Secondary | ICD-10-CM | POA: Insufficient documentation

## 2020-03-07 DIAGNOSIS — C61 Malignant neoplasm of prostate: Secondary | ICD-10-CM | POA: Diagnosis not present

## 2020-03-07 HISTORY — DX: Malignant neoplasm of prostate: C61

## 2020-03-07 NOTE — Progress Notes (Signed)
Radiation Oncology Follow up Note  Name: Alexander Cooke   Date:   03/07/2020 MRN:  [id number] DOB: 1950/12/28    This 69 y.o. male presents to the clinic today for 1 month follow-up status post.  Image guided IMRT radiation therapy for Gleason seven (3+4) adenocarcinoma the prostate  REFERRING PROVIDER: Ronnell Freshwater, NP  HPI: Patient is a 69 year old male now at 1 month having completed external beam image guided IMRT radiation therapy for Gleason seven adenocarcinoma the prostate.  Presenting with a PSA of 6.4.  Seen today in routine follow-up he is doing well.  He specifically denies diarrhea or dysuria he does have nocturia x3.  No significant urgency or frequency.  COMPLICATIONS OF TREATMENT: none  FOLLOW UP COMPLIANCE: keeps appointments   PHYSICAL EXAM:  BP (!) 134/94 (BP Location: Left Arm, Patient Position: Sitting, Cuff Size: Large)   Pulse 70   Temp 97.6 F (36.4 C) (Tympanic)   Resp 20   Wt 194 lb 4.8 oz (88.1 kg)   BMI 30.43 kg/m  Well-developed well-nourished patient in NAD. HEENT reveals PERLA, EOMI, discs not visualized.  Oral cavity is clear. No oral mucosal lesions are identified. Neck is clear without evidence of cervical or supraclavicular adenopathy. Lungs are clear to A&P. Cardiac examination is essentially unremarkable with regular rate and rhythm without murmur rub or thrill. Abdomen is benign with no organomegaly or masses noted. Motor sensory and DTR levels are equal and symmetric in the upper and lower extremities. Cranial nerves II through XII are grossly intact. Proprioception is intact. No peripheral adenopathy or edema is identified. No motor or sensory levels are noted. Crude visual fields are within normal range.  RADIOLOGY RESULTS: No current films to review  PLAN: Present time patient is 1 month out from external beam radiation therapy for prostate cancer with very low side effect profile.  I am pleased with his overall progress.  I have asked to  see him back in 3 months with a PSA prior to that visit.  Patient knows to call with any concerns.  I would like to take this opportunity to thank you for allowing me to participate in the care of your patient.Noreene Filbert, MD

## 2020-06-13 ENCOUNTER — Other Ambulatory Visit: Payer: Self-pay

## 2020-06-13 ENCOUNTER — Inpatient Hospital Stay: Payer: Medicare HMO | Attending: Radiation Oncology

## 2020-06-13 DIAGNOSIS — C61 Malignant neoplasm of prostate: Secondary | ICD-10-CM | POA: Diagnosis not present

## 2020-06-13 LAB — PSA: Prostatic Specific Antigen: 2.09 ng/mL (ref 0.00–4.00)

## 2020-06-17 ENCOUNTER — Encounter: Payer: Self-pay | Admitting: Radiation Oncology

## 2020-06-20 ENCOUNTER — Other Ambulatory Visit: Payer: Self-pay

## 2020-06-20 ENCOUNTER — Other Ambulatory Visit: Payer: Self-pay | Admitting: *Deleted

## 2020-06-20 ENCOUNTER — Encounter: Payer: Self-pay | Admitting: Radiation Oncology

## 2020-06-20 ENCOUNTER — Ambulatory Visit
Admission: RE | Admit: 2020-06-20 | Discharge: 2020-06-20 | Disposition: A | Discharge status: Home / Self Care | Payer: Medicare HMO | Source: Ambulatory Visit | Attending: Radiation Oncology | Admitting: Radiation Oncology

## 2020-06-20 VITALS — BP 134/91 | HR 67 | Temp 96.1°F | Wt 192.0 lb

## 2020-06-20 DIAGNOSIS — C61 Malignant neoplasm of prostate: Secondary | ICD-10-CM

## 2020-06-20 NOTE — Progress Notes (Signed)
Radiation Oncology Follow up Note  Name: Alexander Cooke   Date:   06/20/2020 MRN:  [id number] DOB: May 08, 1951    This 69 y.o. male presents to the clinic today for 76-month follow-up status post image guided IMRT radiation therapy for Gleason 7 (3+4) adenocarcinoma the prostate presenting with a PSA of 6.4.  REFERRING PROVIDER: Ronnell Freshwater, NP  HPI: Patient is a 69 year old male now out 4 months having completed image guided IMRT radiation therapy for Gleason 7 adenocarcinoma the prostate.  Seen today in routine follow-up he is doing well.  He specifically denies any increased lower urinary tract symptoms diarrhea or fatigue.  His most recent PSA is 0.16.  COMPLICATIONS OF TREATMENT: none  FOLLOW UP COMPLIANCE: keeps appointments   PHYSICAL EXAM:  BP (!) 134/91   Pulse 67   Temp (!) 96.1 F (35.6 C) (Tympanic)   Wt 192 lb (87.1 kg)   BMI 30.07 kg/m  Well-developed well-nourished patient in NAD. HEENT reveals PERLA, EOMI, discs not visualized.  Oral cavity is clear. No oral mucosal lesions are identified. Neck is clear without evidence of cervical or supraclavicular adenopathy. Lungs are clear to A&P. Cardiac examination is essentially unremarkable with regular rate and rhythm without murmur rub or thrill. Abdomen is benign with no organomegaly or masses noted. Motor sensory and DTR levels are equal and symmetric in the upper and lower extremities. Cranial nerves II through XII are grossly intact. Proprioception is intact. No peripheral adenopathy or edema is identified. No motor or sensory levels are noted. Crude visual fields are within normal range.  RADIOLOGY RESULTS: No current films to review  PLAN: Present time patient is doing well very low side effect profile.  His PSA is declining now 2.0.  I have asked to see him back in 6 months for follow-up with a PSA at that time we will make further recommendations based on the continued monitoring of his PSA.  Patient knows to call  with any concerns.  Follow-up in 6 months with a PSA prior was made.  I would like to take this opportunity to thank you for allowing me to participate in the care of your patient.Noreene Filbert, MD

## 2020-06-28 ENCOUNTER — Ambulatory Visit: Payer: Medicare HMO | Admitting: Hospice and Palliative Medicine

## 2020-07-01 ENCOUNTER — Other Ambulatory Visit: Payer: Self-pay

## 2020-07-01 DIAGNOSIS — R972 Elevated prostate specific antigen [PSA]: Secondary | ICD-10-CM

## 2020-07-04 ENCOUNTER — Other Ambulatory Visit: Payer: Medicare HMO

## 2020-07-05 ENCOUNTER — Other Ambulatory Visit: Payer: Medicare HMO

## 2020-07-05 ENCOUNTER — Other Ambulatory Visit: Payer: Self-pay

## 2020-07-05 DIAGNOSIS — C61 Malignant neoplasm of prostate: Secondary | ICD-10-CM

## 2020-07-06 ENCOUNTER — Ambulatory Visit: Payer: Self-pay | Admitting: Urology

## 2020-07-06 LAB — PSA: Prostate Specific Ag, Serum: 3.5 ng/mL (ref 0.0–4.0)

## 2020-07-21 ENCOUNTER — Ambulatory Visit (INDEPENDENT_AMBULATORY_CARE_PROVIDER_SITE_OTHER): Payer: Medicare HMO | Admitting: Urology

## 2020-07-21 ENCOUNTER — Other Ambulatory Visit: Payer: Self-pay

## 2020-07-21 ENCOUNTER — Encounter: Payer: Self-pay | Admitting: Urology

## 2020-07-21 VITALS — BP 143/89 | HR 80 | Ht 67.0 in | Wt 192.8 lb

## 2020-07-21 DIAGNOSIS — C61 Malignant neoplasm of prostate: Secondary | ICD-10-CM

## 2020-07-21 NOTE — Progress Notes (Signed)
° °  07/21/2020 4:43 PM   Om Alexander Cooke 1950-12-25 [id number]  Reason for visit: Follow up prostate cancer  HPI: I saw Alexander Cooke back in urology clinic for follow-up of prostate cancer.  He is a 69 year old male who was found of an elevated PSA of 6.4 and underwent a prostate biopsy on 10/21/2019 showing a 30 g prostate with a PSA density of 0.17, 4/12 cores positive for Gleason score 3+4= 7 disease with max core involvement of 12%.  He denies any urinary symptoms at baseline unless he consumes a large volume of beer.  He denies any erectile dysfunction.  He underwent external beam radiation with Dr. Baruch Gouty that was completed in May 2021.  He deferred ADT with his low-volume favorable intermediate risk disease.  His initial post radiation PSA in September 2021 was 2.09, and most recent PSA on 07/05/2020 was 3.5.  We discussed at length the importance of monitoring the PSA moving forward, and the concept of a PSA bump after radiation.  He is done extremely well since undergoing radiation.  Specifically denies any urinary complaints or gross hematuria, and is achieving erections sufficient for sexual activity.  He occasionally has nocturia when he drinks heavily in the evenings(6+ beers).   RTC 7 months with PSA prior Has follow-up with Dr. Baruch Gouty in 4 months for [place], [place] [address] [place], [place] [address] [phone]

## 2020-07-28 ENCOUNTER — Encounter: Payer: Self-pay | Admitting: Hospice and Palliative Medicine

## 2020-07-28 ENCOUNTER — Ambulatory Visit (INDEPENDENT_AMBULATORY_CARE_PROVIDER_SITE_OTHER): Payer: Medicare HMO | Admitting: Hospice and Palliative Medicine

## 2020-07-28 ENCOUNTER — Other Ambulatory Visit: Payer: Self-pay

## 2020-07-28 DIAGNOSIS — M1A9XX0 Chronic gout, unspecified, without tophus (tophi): Secondary | ICD-10-CM

## 2020-07-28 DIAGNOSIS — F1010 Alcohol abuse, uncomplicated: Secondary | ICD-10-CM | POA: Diagnosis not present

## 2020-07-28 DIAGNOSIS — I10 Essential (primary) hypertension: Secondary | ICD-10-CM

## 2020-07-28 DIAGNOSIS — Z91148 Patient's other noncompliance with medication regimen for other reason: Secondary | ICD-10-CM

## 2020-07-28 DIAGNOSIS — Z0001 Encounter for general adult medical examination with abnormal findings: Secondary | ICD-10-CM

## 2020-07-28 DIAGNOSIS — Z9114 Patient's other noncompliance with medication regimen: Secondary | ICD-10-CM

## 2020-07-28 DIAGNOSIS — F17219 Nicotine dependence, cigarettes, with unspecified nicotine-induced disorders: Secondary | ICD-10-CM | POA: Diagnosis not present

## 2020-07-28 MED ORDER — COLCHICINE-PROBENECID 0.5-500 MG PO TABS: 1.0000 | ORAL_TABLET | Freq: Every day | ORAL | 11 refills | Status: DC

## 2020-07-28 NOTE — Progress Notes (Signed)
[facility] [place], [place] [address]   Internal MEDICINE  Office Visit Note  Patient Name: Alexander Cooke  [id number]  [id number]  Date of Service: 07/29/2020  Chief Complaint  Patient presents with  . Annual Exam  . Quality Metric Gaps    Hep C screen, Tdap, Colonoscopy, pna vacc  . 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 amLODipine (NORVASC) 5 MG tablet Take 1 tablet (5 mg total) by mouth daily.  . colchicine-probenecid 0.5-500 MG tablet Take 1 tablet by mouth daily.  Marland Kitchen losartan (COZAAR) 50 MG tablet Take 1 tablet (50 mg total) by mouth daily.  . [DISCONTINUED] colchicine-probenecid 0.5-500 MG tablet TAKE 1 TABLET BY MOUTH EVERY DAY   No facility-administered encounter medications on file as of 07/28/2020.    Surgical History: Past Surgical History:  Procedure Laterality Date  . none      Medical History: Past Medical History:  Diagnosis Date  . Arthritis   . Hypertension   . Prostate cancer [facility])     Family History: Family History  Problem Relation Age of  Onset  . Diabetes Sister   . Prostate cancer Brother    Review of Systems  Constitutional: Negative for chills, fatigue and unexpected weight change.  HENT: Negative for congestion, postnasal drip, rhinorrhea, sneezing and sore throat.   Eyes: Negative for redness.  Respiratory: Negative for cough, chest tightness and shortness of breath.   Cardiovascular: Negative for chest pain, palpitations and leg swelling.  Gastrointestinal: Negative for abdominal pain, constipation, diarrhea, nausea and vomiting.  Genitourinary: Negative for dysuria, frequency and penile swelling.  Musculoskeletal: Negative for arthralgias, back pain, joint swelling and neck pain.  Skin: Negative for rash.  Neurological: Negative for dizziness, tremors, light-headedness, numbness and headaches.  Hematological: Negative for adenopathy. Does not bruise/bleed easily.  Psychiatric/Behavioral: Negative for behavioral problems (Depression), sleep disturbance and suicidal ideas. The patient is not nervous/anxious.      Vital Signs: BP (!) 134/92   Pulse 70   Temp 98 F (36.7 C)   Resp 16   Ht 5\' 8"  (1.727 m)   Wt 192 lb 3.2 oz (87.2 kg)   SpO2 97%   BMI 29.22 kg/m    Physical Exam Vitals reviewed.  Constitutional:      Appearance: Normal appearance.  HENT:     Right Ear: Tympanic membrane normal.     Left Ear: Tympanic membrane normal.     Mouth/Throat:     Mouth: Mucous membranes are moist.     Pharynx: Oropharynx is clear.  Cardiovascular:     Rate and  Rhythm: Normal rate and regular rhythm.     Pulses: Normal pulses.     Heart sounds: Normal heart sounds.  Pulmonary:     Effort: Pulmonary effort is normal.     Breath sounds: Normal breath sounds.  Abdominal:     General: Abdomen is flat.  Musculoskeletal:        General: Normal range of motion.     Cervical back: Normal range of motion.  Skin:    General: Skin is warm.  Neurological:     General: No focal deficit present.     Mental  Status: He is alert and oriented to person, place, and time. Mental status is at baseline.  Psychiatric:        Mood and Affect: Mood normal.        Behavior: Behavior normal.        Thought Content: Thought content normal.      LABS: Recent Results (from the past 2160 hour(s))  PSA     Status: None   Collection Time: 06/13/20  8:43 AM  Result Value Ref Range   Prostatic Specific Antigen 2.09 0.00 - 4.00 ng/mL    Comment: (NOTE) While PSA levels of <=4.0 ng/ml are reported as reference range, some men with levels below 4.0 ng/ml can have prostate cancer and many men with PSA above 4.0 ng/ml do not have prostate cancer.  Other tests such as free PSA, age specific reference ranges, PSA velocity and PSA doubling time may be helpful especially in men less than 13 years old. Performed at [facility], [place] [address] [place], [place] [address]   PSA     Status: None   Collection Time: 07/05/20  1:44 PM  Result Value Ref Range   Prostate Specific Ag, Serum 3.5 0.0 - 4.0 ng/mL    Comment: Roche ECLIA methodology. According to the American Urological Association, Serum PSA should decrease and remain at undetectable levels after radical prostatectomy. The AUA defines biochemical recurrence as an initial PSA value 0.2 ng/mL or greater followed by a subsequent confirmatory PSA value 0.2 ng/mL or greater. Values obtained with different assay methods or kits cannot be used interchangeably. Results cannot be interpreted as absolute evidence of the presence or absence of malignant disease.     Assessment/Plan: 1. Encounter for routine adult health examination with abnormal findings Well appearing 69 year old AA male No longer interested in any further testing for prevention-refuses colonoscopy Will review annual lab and adjust therapy as indicated - Comprehensive Metabolic Panel (CMET) - CBC w/Diff/Platelet - Lipid Panel With LDL/HDL Ratio  2. Essential hypertension BP  elevated today--has not taken his medications in several weeks Discussed the negative risks associated with uncontrolled BP  3. Alcohol consumption binge drinking Continues to drink up to 6-8 beers a night, will skip a few nights without drinking Discussed the dangers of alcohol consumption with chronic use as well as excessive intake  4. Cigarette nicotine dependence with nicotine-induced disorder Continues to smoke especially at night while he is consuming alcohol--not interested in cessation at this time Smoking cessation counseling: 1. Pt acknowledges the risks of long term smoking, she will try to quite smoking. 2. Options for different medications including nicotine products, chewing gum, patch etc, Wellbutrin and Chantix is discussed 3. Goal and date of compete cessation is discussed 4. Total time spent in smoking cessation is 15 min.   5. Non compliance w medication regimen Discussed the importance of adhering to all medical therapies Will  start taking his blood pressure medication on a regular basis  6. Chronic gout without tophus, unspecified cause, unspecified site Stable at this time, requesting refills - colchicine-probenecid 0.5-500 MG tablet; Take 1 tablet by mouth daily.  Dispense: 30 tablet; Refill: 11  General Counseling: Aloysuis verbalizes understanding of the findings of todays visit and agrees with plan of treatment. I have discussed any further diagnostic evaluation that may be needed or ordered today. We also reviewed his medications today. he has been encouraged to call the office with any questions or concerns that should arise related to todays visit.    Counseling: Hypertension Counseling:   The following hypertensive lifestyle modification were recommended and discussed:  1. Limiting alcohol intake to less than 1 oz/day of ethanol:(24 oz of beer or 8 oz of wine or 2 oz of 100-proof whiskey). 2. Take baby ASA 81 mg daily. 3. Importance of regular aerobic  exercise and losing weight. 4. Reduce dietary saturated fat and cholesterol intake for overall cardiovascular health. 5. Maintaining adequate dietary potassium, calcium, and magnesium intake. 6. Regular monitoring of the blood pressure. 7. Reduce sodium intake to less than 100 mmol/day (less than 2.3 gm of sodium or less than 6 gm of sodium choride)    Orders Placed This Encounter  Procedures  . Comprehensive Metabolic Panel (CMET)  . CBC w/Diff/Platelet  . Lipid Panel With LDL/HDL Ratio    Meds ordered this encounter  Medications  . colchicine-probenecid 0.5-500 MG tablet    Sig: Take 1 tablet by mouth daily.    Dispense:  30 tablet    Refill:  11    Total time spent: 30 Minutes  Time spent includes review of chart, medications, test results, and follow up plan with the patient.   This patient was seen by Casey Burkitt AGNP-C Collaboration with Dr Lavera Guise as a part of collaborative care agreement   Tanna Furry. [facility] Internal Medicine

## 2020-07-29 ENCOUNTER — Encounter: Payer: Self-pay | Admitting: Hospice and Palliative Medicine

## 2020-08-22 DIAGNOSIS — R6889 Other general symptoms and signs: Secondary | ICD-10-CM | POA: Diagnosis not present

## 2020-08-22 DIAGNOSIS — Z0001 Encounter for general adult medical examination with abnormal findings: Secondary | ICD-10-CM | POA: Diagnosis not present

## 2020-08-22 DIAGNOSIS — E782 Mixed hyperlipidemia: Secondary | ICD-10-CM | POA: Diagnosis not present

## 2020-08-23 LAB — COMPREHENSIVE METABOLIC PANEL
ALT: 21 IU/L (ref 0–44)
AST: 20 IU/L (ref 0–40)
Albumin/Globulin Ratio: 1.4 (ref 1.2–2.2)
Albumin: 4.3 g/dL (ref 3.8–4.8)
Alkaline Phosphatase: 111 IU/L (ref 44–121)
BUN/Creatinine Ratio: 11 (ref 10–24)
BUN: 14 mg/dL (ref 8–27)
Bilirubin Total: 0.8 mg/dL (ref 0.0–1.2)
CO2: 21 mmol/L (ref 20–29)
Calcium: 9.7 mg/dL (ref 8.6–10.2)
Chloride: 108 mmol/L — ABNORMAL HIGH (ref 96–106)
Creatinine, Ser: 1.22 mg/dL (ref 0.76–1.27)
GFR calc Af Amer: 69 mL/min/{1.73_m2} (ref >59)
GFR calc non Af Amer: 60 mL/min/{1.73_m2} (ref >59)
Globulin, Total: 3.1 g/dL (ref 1.5–4.5)
Glucose: 109 mg/dL — ABNORMAL HIGH (ref 65–99)
Potassium: 4.5 mmol/L (ref 3.5–5.2)
Sodium: 141 mmol/L (ref 134–144)
Total Protein: 7.4 g/dL (ref 6.0–8.5)

## 2020-08-23 LAB — CBC WITH DIFFERENTIAL/PLATELET
Basophils Absolute: 0 10*3/uL (ref 0.0–0.2)
Basos: 1 %
EOS (ABSOLUTE): 0.1 10*3/uL (ref 0.0–0.4)
Eos: 2 %
Hematocrit: 46.3 % (ref 37.5–51.0)
Hemoglobin: 15.6 g/dL (ref 13.0–17.7)
Immature Grans (Abs): 0 10*3/uL (ref 0.0–0.1)
Immature Granulocytes: 0 %
Lymphocytes Absolute: 1.5 10*3/uL (ref 0.7–3.1)
Lymphs: 28 %
MCH: 31.5 pg (ref 26.6–33.0)
MCHC: 33.7 g/dL (ref 31.5–35.7)
MCV: 93 fL (ref 79–97)
Monocytes Absolute: 0.5 10*3/uL (ref 0.1–0.9)
Monocytes: 9 %
Neutrophils Absolute: 3.3 10*3/uL (ref 1.4–7.0)
Neutrophils: 60 %
Platelets: 191 10*3/uL (ref 150–450)
RBC: 4.96 x10E6/uL (ref 4.14–5.80)
RDW: 13.3 % (ref 11.6–15.4)
WBC: 5.4 10*3/uL (ref 3.4–10.8)

## 2020-08-23 LAB — LIPID PANEL WITH LDL/HDL RATIO
Cholesterol, Total: 195 mg/dL (ref 100–199)
HDL: 64 mg/dL (ref >39)
LDL Chol Calc (NIH): 100 mg/dL — ABNORMAL HIGH (ref 0–99)
LDL/HDL Ratio: 1.6 ratio (ref 0.0–3.6)
Triglycerides: 182 mg/dL — ABNORMAL HIGH (ref 0–149)
VLDL Cholesterol Cal: 31 mg/dL (ref 5–40)

## 2020-08-23 NOTE — Progress Notes (Signed)
Labs reviewed, will discuss at next appt. May need to consider statin therapy.

## 2020-09-27 ENCOUNTER — Other Ambulatory Visit (HOSPITAL_COMMUNITY): Payer: Self-pay | Admitting: Physician Assistant

## 2020-09-27 ENCOUNTER — Other Ambulatory Visit: Payer: Self-pay | Admitting: Physician Assistant

## 2020-09-27 DIAGNOSIS — H9121 Sudden idiopathic hearing loss, right ear: Secondary | ICD-10-CM

## 2020-09-27 DIAGNOSIS — H6123 Impacted cerumen, bilateral: Secondary | ICD-10-CM | POA: Diagnosis not present

## 2020-09-27 DIAGNOSIS — H9313 Tinnitus, bilateral: Secondary | ICD-10-CM | POA: Diagnosis not present

## 2020-09-27 DIAGNOSIS — H903 Sensorineural hearing loss, bilateral: Secondary | ICD-10-CM | POA: Diagnosis not present

## 2020-09-27 DIAGNOSIS — H90A21 Sensorineural hearing loss, unilateral, right ear, with restricted hearing on the contralateral side: Secondary | ICD-10-CM | POA: Diagnosis not present

## 2020-10-10 ENCOUNTER — Other Ambulatory Visit: Payer: Self-pay

## 2020-10-10 ENCOUNTER — Ambulatory Visit
Admission: RE | Admit: 2020-10-10 | Discharge: 2020-10-10 | Disposition: A | Discharge status: Home / Self Care | Payer: Medicare HMO | Source: Ambulatory Visit | Attending: Physician Assistant | Admitting: Physician Assistant

## 2020-10-10 DIAGNOSIS — M2548 Effusion, other site: Secondary | ICD-10-CM | POA: Diagnosis not present

## 2020-10-10 DIAGNOSIS — H748X3 Other specified disorders of middle ear and mastoid, bilateral: Secondary | ICD-10-CM | POA: Diagnosis not present

## 2020-10-10 DIAGNOSIS — H9121 Sudden idiopathic hearing loss, right ear: Secondary | ICD-10-CM | POA: Diagnosis not present

## 2020-10-10 DIAGNOSIS — H9120 Sudden idiopathic hearing loss, unspecified ear: Secondary | ICD-10-CM | POA: Diagnosis not present

## 2020-10-10 MED ORDER — GADOBUTROL 1 MMOL/ML IV SOLN
9.0000 mL | Freq: Once | INTRAVENOUS | Status: AC | PRN
  Administered 2020-10-10: 9 mL via INTRAVENOUS

## 2020-10-11 ENCOUNTER — Other Ambulatory Visit: Payer: Self-pay | Admitting: Physician Assistant

## 2020-10-11 DIAGNOSIS — H903 Sensorineural hearing loss, bilateral: Secondary | ICD-10-CM | POA: Diagnosis not present

## 2020-10-11 DIAGNOSIS — H9041 Sensorineural hearing loss, unilateral, right ear, with unrestricted hearing on the contralateral side: Secondary | ICD-10-CM

## 2020-10-11 DIAGNOSIS — H90A21 Sensorineural hearing loss, unilateral, right ear, with restricted hearing on the contralateral side: Secondary | ICD-10-CM | POA: Diagnosis not present

## 2020-10-11 DIAGNOSIS — H9121 Sudden idiopathic hearing loss, right ear: Secondary | ICD-10-CM | POA: Diagnosis not present

## 2020-10-11 DIAGNOSIS — D333 Benign neoplasm of cranial nerves: Secondary | ICD-10-CM | POA: Diagnosis not present

## 2020-10-27 ENCOUNTER — Encounter: Payer: Self-pay | Admitting: Physician Assistant

## 2020-10-27 ENCOUNTER — Ambulatory Visit (INDEPENDENT_AMBULATORY_CARE_PROVIDER_SITE_OTHER): Payer: Medicare HMO | Admitting: Physician Assistant

## 2020-10-27 VITALS — BP 130/82 | HR 65 | Temp 97.9°F | Resp 16 | Ht 68.0 in | Wt 192.0 lb

## 2020-10-27 DIAGNOSIS — I10 Essential (primary) hypertension: Secondary | ICD-10-CM

## 2020-10-27 DIAGNOSIS — Z91148 Patient's other noncompliance with medication regimen for other reason: Secondary | ICD-10-CM

## 2020-10-27 DIAGNOSIS — E782 Mixed hyperlipidemia: Secondary | ICD-10-CM

## 2020-10-27 DIAGNOSIS — F1010 Alcohol abuse, uncomplicated: Secondary | ICD-10-CM | POA: Diagnosis not present

## 2020-10-27 DIAGNOSIS — F17219 Nicotine dependence, cigarettes, with unspecified nicotine-induced disorders: Secondary | ICD-10-CM | POA: Diagnosis not present

## 2020-10-27 DIAGNOSIS — Z9114 Patient's other noncompliance with medication regimen: Secondary | ICD-10-CM | POA: Diagnosis not present

## 2020-10-27 NOTE — Progress Notes (Signed)
[facility] [place], [place] [address]  Internal MEDICINE  Office Visit Note  Patient Name: Alexander Cooke  [id number]  [id number]  Date of Service: 10/28/2020  Chief Complaint  Patient presents with  . Hypertension    Vertigo   . Asthma  . Quality Metric Gaps    Pneumonia vaccine     HPI Pt returns for 3 month f/u to review recent blood work. Discussed elevated LDL and TG, but he does not wish to start any statin therapy. He would rather work on his diet. He is not taking his BP meds, but states BP has been good at home since working out more. He is going to continue to monitor it, but does not want to take any medications if he doesn't think he needs to. 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 He states he had some vertigo on Sunday. He woke up with the room spinning and vomited. He had to close his eyes and rest. It went away after that and has not happened again since then. States he had not eaten anything when it came on. On Saturday night, before the vertigo, he had 4 or 5 shots of liquor and a 6 pack of beer and states he was very dehydrated. Previously, he was drinking a few beers per day, but if a football game is on he is drinking a few more. He denies any breathing complaints.   Current Medication: Outpatient Encounter Medications as of 10/27/2020  Medication Sig  . amLODipine (NORVASC) 5 MG tablet Take 1 tablet (5 mg total) by mouth daily. (Patient not taking: Reported on 10/27/2020)  . colchicine-probenecid 0.5-500 MG tablet Take 1 tablet by mouth daily. (Patient not taking: Reported on 10/27/2020)  . losartan (COZAAR) 50 MG tablet Take 1 tablet (50 mg total) by mouth daily. (Patient not taking: Reported on 10/27/2020)   No facility-administered encounter medications on file as of 10/27/2020.    Surgical History: Past Surgical History:  Procedure Laterality Date  . none      Medical History: Past Medical History:  Diagnosis Date  .  Arthritis   . Hypertension   . Prostate cancer [facility])     Family History: Family History  Problem Relation Age of Onset  . Diabetes Sister   . Prostate cancer Brother     Social History   Socioeconomic History  . Marital status: Married    Spouse name: Not on file  . Number of children: Not on file  . Years of education: Not on file  . Highest education level: Not on file  Occupational History  . Not on file  Tobacco Use  . Smoking status: Current Some Day Smoker    Types: Cigarettes  . Smokeless tobacco: Never Used  Vaping Use  . Vaping Use: Never used  Substance and Sexual Activity  . Alcohol use: Yes    Alcohol/week: 1.0 standard drink    Types: 1 Cans of beer per week    Comment: every day  . Drug use: Yes    Types: Marijuana  . Sexual activity: Yes    Birth control/protection: None  Other Topics Concern  . Not on file  Social History Narrative  . Not on file   Social Determinants of Health   Financial Resource Strain: Not on file  Food Insecurity: Not on file  Transportation Needs: Not on file  Physical Activity: Not on file  Stress: Not on file  Social Connections:  Not on file  Intimate Partner Violence: Not on file      Review of Systems  Constitutional: Negative for appetite change, chills, fatigue, fever and unexpected weight change.  HENT: Positive for hearing loss. Negative for congestion, postnasal drip, rhinorrhea, sneezing and sore throat.   Eyes: Negative for redness.  Respiratory: Negative for cough, chest tightness and shortness of breath.   Cardiovascular: Negative for chest pain and palpitations.  Gastrointestinal: Negative for abdominal pain, constipation, diarrhea, nausea and vomiting.  Genitourinary: Negative for dysuria and frequency.  Musculoskeletal: Negative for arthralgias, back pain, joint swelling and neck pain.  Skin: Negative.  Negative for rash.  Neurological: Negative for dizziness, tremors, light-headedness, numbness  and headaches.  Hematological: Negative for adenopathy. Does not bruise/bleed easily.  Psychiatric/Behavioral: Negative for behavioral problems (Depression), sleep disturbance and suicidal ideas. The patient is not nervous/anxious.     Vital Signs: BP 130/82   Pulse 65   Temp 97.9 F (36.6 C)   Resp 16   Ht 5\' 8"  (1.727 m)   Wt 192 lb (87.1 kg)   SpO2 97%   BMI 29.19 kg/m    Physical Exam Constitutional:      General: He is not in acute distress.    Appearance: He is well-developed. He is not diaphoretic.  HENT:     Head: Normocephalic and atraumatic.     Mouth/Throat:     Pharynx: No oropharyngeal exudate.  Eyes:     Pupils: Pupils are equal, round, and reactive to light.  Neck:     Thyroid: No thyromegaly.     Vascular: No JVD.     Trachea: No tracheal deviation.  Cardiovascular:     Rate and Rhythm: Normal rate and regular rhythm.     Heart sounds: Normal heart sounds. No murmur heard. No friction rub. No gallop.   Pulmonary:     Effort: Pulmonary effort is normal. No respiratory distress.     Breath sounds: No wheezing or rales.  Chest:     Chest wall: No tenderness.  Abdominal:     General: Bowel sounds are normal.     Palpations: Abdomen is soft.  Musculoskeletal:        General: Normal range of motion.     Cervical back: Normal range of motion and neck supple.  Lymphadenopathy:     Cervical: No cervical adenopathy.  Skin:    General: Skin is warm and dry.  Neurological:     Mental Status: He is alert and oriented to person, place, and time.     Cranial Nerves: No cranial nerve deficit.  Psychiatric:        Behavior: Behavior normal.        Thought Content: Thought content normal.        Judgment: Judgment normal.        Assessment/Plan: 1. Hypertension, unspecified type BP well controlled despite pt not adhering to medication. He will continue to monitor it at home. States he will not take the medication when he does not believe he needs it,  but will take it if it is high.  2. Mixed hyperlipidemia Mildly elevated TG and LDL. Pt not willing to start stain therapy at his time and will work on his diet.  3. Non compliance w medication regimen Pt does not adhere to medications as prescribed. He has not been taking his BP meds, but states he will take it if he finds it is elevated upon checking it at home.  4. Alcohol  consumption binge drinking Continues to drink several beers per night, with some nights drinking 4-5 shots plus 6 beers. One of these binges resulted in vertigo, nausea and dehydration the next morning. Counseled on the dangers of excessive alcohol consumption and chronic use. He will try to limit mixing liquor and beer, but is unwilling to limit intake further.  5. Cigarette nicotine dependence with nicotine-induced disorder Continues to smoke and is not interested in cessation currently. Smoking cessation counseling: 1. Pt acknowledges the risks of long term smoking, she will try to quite smoking. 2. Options for different medications including nicotine products, chewing gum, patch etc, Wellbutrin and Chantix is discussed 3. Goal and date of compete cessation is discussed 4. Total time spent in smoking cessation is 10 min.   General Counseling: Sylvestre verbalizes understanding of the findings of todays visit and agrees with plan of treatment. I have discussed any further diagnostic evaluation that may be needed or ordered today. We also reviewed his medications today. he has been encouraged to call the office with any questions or concerns that should arise related to todays visit.      Total time spent: 30 Minutes Time spent includes review of chart, medications, test results, and follow up plan with the patient.      Dr Lavera Guise Internal medicine

## 2020-11-07 DIAGNOSIS — H903 Sensorineural hearing loss, bilateral: Secondary | ICD-10-CM | POA: Diagnosis not present

## 2020-12-01 ENCOUNTER — Other Ambulatory Visit: Payer: Self-pay

## 2020-12-01 DIAGNOSIS — I10 Essential (primary) hypertension: Secondary | ICD-10-CM

## 2020-12-01 MED ORDER — AMLODIPINE BESYLATE 5 MG PO TABS: 5.0000 mg | ORAL_TABLET | Freq: Every day | ORAL | 3 refills | Status: DC

## 2020-12-01 MED ORDER — LOSARTAN POTASSIUM 50 MG PO TABS: 50.0000 mg | ORAL_TABLET | Freq: Every day | ORAL | 3 refills | Status: DC

## 2020-12-12 ENCOUNTER — Other Ambulatory Visit: Payer: Self-pay

## 2020-12-12 ENCOUNTER — Inpatient Hospital Stay: Payer: Medicare HMO | Attending: Radiation Oncology

## 2020-12-12 DIAGNOSIS — C61 Malignant neoplasm of prostate: Secondary | ICD-10-CM | POA: Diagnosis not present

## 2020-12-12 LAB — PSA: Prostatic Specific Antigen: 1.26 ng/mL (ref 0.00–4.00)

## 2020-12-19 ENCOUNTER — Encounter: Payer: Self-pay | Admitting: Radiation Oncology

## 2020-12-19 ENCOUNTER — Other Ambulatory Visit: Payer: Self-pay

## 2020-12-19 ENCOUNTER — Ambulatory Visit
Admission: RE | Admit: 2020-12-19 | Discharge: 2020-12-19 | Disposition: A | Discharge status: Home / Self Care | Payer: Medicare HMO | Source: Ambulatory Visit | Attending: Radiation Oncology | Admitting: Radiation Oncology

## 2020-12-19 VITALS — BP 179/101 | HR 68 | Temp 96.4°F | Wt 196.2 lb

## 2020-12-19 DIAGNOSIS — C61 Malignant neoplasm of prostate: Secondary | ICD-10-CM

## 2020-12-19 DIAGNOSIS — Z923 Personal history of irradiation: Secondary | ICD-10-CM | POA: Insufficient documentation

## 2020-12-19 DIAGNOSIS — Z08 Encounter for follow-up examination after completed treatment for malignant neoplasm: Secondary | ICD-10-CM | POA: Diagnosis not present

## 2020-12-19 NOTE — Progress Notes (Signed)
Radiation Oncology Follow up Note  Name: Alexander Cooke   Date:   12/19/2020 MRN:  [id number] DOB: [birth year]    This 70 y.o. male presents to the clinic today for 55-month follow-up status post image guided IMRT radiation therapy for Gleason 7 (3+4) adenocarcinoma the prostate presenting with a PSA of 6.4.  REFERRING PROVIDER: Ronnell Freshwater, NP  HPI: Patient is a 70 year old male now out 10 months having completed IMRT radiation therapy to his prostate for Gleason 7 adenocarcinoma seen today in routine follow-up he is doing well specifically denies any increased lower urinary tract symptoms diarrhea or fatigue.Marland Kitchen  His most recent PSA was 1.2 down from 2.096 months ago.  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  COMPLICATIONS OF TREATMENT: none  FOLLOW UP COMPLIANCE: keeps appointments   PHYSICAL EXAM:  BP (!) 179/101 (BP Location: Left Arm, Patient Position: Sitting)   Pulse 68   Temp (!) 96.4 F (35.8 C) (Tympanic)   Wt 196 lb 3.2 oz (89 kg)   BMI 29.83 kg/m  Well-developed well-nourished patient in NAD. HEENT reveals PERLA, EOMI, discs not visualized.  Oral cavity is clear. No oral mucosal lesions are identified. Neck is clear without evidence of cervical or supraclavicular adenopathy. Lungs are clear to A&P. Cardiac examination is essentially unremarkable with regular rate and rhythm without murmur rub or thrill. Abdomen is benign with no organomegaly or masses noted. Motor sensory and DTR levels are equal and symmetric in the upper and lower extremities. Cranial nerves II through XII are grossly intact. Proprioception is intact. No peripheral adenopathy or edema is identified. No motor or sensory levels are noted. Crude  visual fields are within normal range.  RADIOLOGY RESULTS: MRI brain reviewed compatible with above-stated findings  PLAN: Present time patient is doing well under excellent biochemical control of his prostate cancer.  I am pleased with his overall progress.  I have asked to see him out in 6 months for follow-up.  I would like to take this opportunity to thank you for allowing me to participate in the care of your patient.Noreene Filbert, MD

## 2021-02-17 ENCOUNTER — Other Ambulatory Visit: Payer: Self-pay

## 2021-02-17 ENCOUNTER — Encounter: Payer: Self-pay | Admitting: Urology

## 2021-02-22 ENCOUNTER — Ambulatory Visit: Payer: Self-pay | Admitting: Urology

## 2021-02-24 ENCOUNTER — Encounter: Payer: Self-pay | Admitting: Urology

## 2021-03-03 ENCOUNTER — Other Ambulatory Visit: Payer: Self-pay

## 2021-03-03 ENCOUNTER — Emergency Department
Admission: EM | Admit: 2021-03-03 | Discharge: 2021-03-03 | Disposition: A | Discharge status: Home / Self Care | Payer: Medicare HMO | Attending: Emergency Medicine | Admitting: Emergency Medicine

## 2021-03-03 ENCOUNTER — Emergency Department: Payer: Medicare HMO

## 2021-03-03 DIAGNOSIS — M47816 Spondylosis without myelopathy or radiculopathy, lumbar region: Secondary | ICD-10-CM | POA: Diagnosis not present

## 2021-03-03 DIAGNOSIS — N132 Hydronephrosis with renal and ureteral calculous obstruction: Secondary | ICD-10-CM | POA: Diagnosis not present

## 2021-03-03 DIAGNOSIS — R310 Gross hematuria: Secondary | ICD-10-CM

## 2021-03-03 DIAGNOSIS — K449 Diaphragmatic hernia without obstruction or gangrene: Secondary | ICD-10-CM | POA: Diagnosis not present

## 2021-03-03 DIAGNOSIS — F17210 Nicotine dependence, cigarettes, uncomplicated: Secondary | ICD-10-CM | POA: Diagnosis not present

## 2021-03-03 DIAGNOSIS — I10 Essential (primary) hypertension: Secondary | ICD-10-CM | POA: Insufficient documentation

## 2021-03-03 DIAGNOSIS — Z8546 Personal history of malignant neoplasm of prostate: Secondary | ICD-10-CM | POA: Diagnosis not present

## 2021-03-03 DIAGNOSIS — R319 Hematuria, unspecified: Secondary | ICD-10-CM | POA: Diagnosis present

## 2021-03-03 DIAGNOSIS — N200 Calculus of kidney: Secondary | ICD-10-CM | POA: Diagnosis not present

## 2021-03-03 DIAGNOSIS — D179 Benign lipomatous neoplasm, unspecified: Secondary | ICD-10-CM | POA: Diagnosis not present

## 2021-03-03 LAB — CBC WITH DIFFERENTIAL/PLATELET
Abs Immature Granulocytes: 0.03 10*3/uL (ref 0.00–0.07)
Basophils Absolute: 0 10*3/uL (ref 0.0–0.1)
Basophils Relative: 0 %
Eosinophils Absolute: 0.1 10*3/uL (ref 0.0–0.5)
Eosinophils Relative: 1 %
HCT: 46.8 % (ref 39.0–52.0)
Hemoglobin: 16 g/dL (ref 13.0–17.0)
Immature Granulocytes: 0 %
Lymphocytes Relative: 16 %
Lymphs Abs: 1.2 10*3/uL (ref 0.7–4.0)
MCH: 31.4 pg (ref 26.0–34.0)
MCHC: 34.2 g/dL (ref 30.0–36.0)
MCV: 91.9 fL (ref 80.0–100.0)
Monocytes Absolute: 0.6 10*3/uL (ref 0.1–1.0)
Monocytes Relative: 7 %
Neutro Abs: 5.7 10*3/uL (ref 1.7–7.7)
Neutrophils Relative %: 76 %
Platelets: 163 10*3/uL (ref 150–400)
RBC: 5.09 MIL/uL (ref 4.22–5.81)
RDW: 13.1 % (ref 11.5–15.5)
WBC: 7.6 10*3/uL (ref 4.0–10.5)
nRBC: 0 % (ref 0.0–0.2)

## 2021-03-03 LAB — BASIC METABOLIC PANEL
Anion gap: 10 (ref 5–15)
BUN: 23 mg/dL (ref 8–23)
CO2: 24 mmol/L (ref 22–32)
Calcium: 9.2 mg/dL (ref 8.9–10.3)
Chloride: 108 mmol/L (ref 98–111)
Creatinine, Ser: 1.13 mg/dL (ref 0.61–1.24)
GFR, Estimated: >60 mL/min (ref >60)
Glucose, Bld: 92 mg/dL (ref 70–99)
Potassium: 4.1 mmol/L (ref 3.5–5.1)
Sodium: 142 mmol/L (ref 135–145)

## 2021-03-03 LAB — URINALYSIS, COMPLETE (UACMP) WITH MICROSCOPIC: Specific Gravity, Urine: 1.023 (ref 1.005–1.030)

## 2021-03-03 MED ORDER — TAMSULOSIN HCL 0.4 MG PO CAPS: 0.4000 mg | ORAL_CAPSULE | Freq: Every day | ORAL | 0 refills | Status: DC

## 2021-03-03 MED ORDER — SODIUM CHLORIDE 0.9 % IV BOLUS
1000.0000 mL | Freq: Once | INTRAVENOUS | Status: AC
  Administered 2021-03-03: 1000 mL via INTRAVENOUS

## 2021-03-03 MED ORDER — ONDANSETRON 4 MG PO TBDP: 4.0000 mg | ORAL_TABLET | Freq: Three times a day (TID) | ORAL | 0 refills | Status: DC | PRN

## 2021-03-03 MED ORDER — ONDANSETRON HCL 4 MG/2ML IJ SOLN
4.0000 mg | Freq: Once | INTRAMUSCULAR | Status: AC
  Administered 2021-03-03: 4 mg via INTRAVENOUS
  Filled 2021-03-03: qty 2

## 2021-03-03 MED ORDER — OXYCODONE-ACETAMINOPHEN 5-325 MG PO TABS: 1.0000 | ORAL_TABLET | ORAL | 0 refills | Status: DC | PRN

## 2021-03-03 NOTE — ED Triage Notes (Addendum)
Pt to ER via POV with complaints of RLQ pain and blood in his urine which started last night.   Reports urine is bright red in color, denies clots. Denies dysuria or urinary frequency. Denies hx of kidney stones or abdominal surgery. Reports attempting to use the bathroom with no relief in abdominal pain.

## 2021-03-03 NOTE — ED Notes (Signed)
Attempted to call patient's wife x 2 -- on home number listed and mobile.  Unable to reach by phone.  Attempted to alert her that patient had been moved to a room and she would be able to join him.

## 2021-03-03 NOTE — ED Provider Notes (Signed)
[facility] Emergency Department Provider Note   ____________________________________________   Event Date/Time   First MD Initiated Contact with Patient 03/03/21 1258     (approximate)  I have reviewed the triage vital signs and the nursing notes.   HISTORY  Chief Complaint Hematuria    HPI Alexander Cooke is a 70 y.o. male with past medical history of hypertension and prostate cancer who presents to the ED complaining of hematuria.  He states that yesterday he started noticing blood in his urine, has passed a few small clots since then.  He denies any associated dysuria but he has had occasional pain in the right lower quadrant of his abdomen.  He has felt nauseous and vomited once here in the ED, but he denies any flank pain.  He has not had any fevers, chills, cough, chest pain, or shortness of breath.  He denies any history of kidney stones or similar symptoms in the past, did receive radiation for prostate cancer about 1 year ago.  He does not take any blood thinners.        Past Medical History:  Diagnosis Date  . Arthritis   . Hypertension   . Prostate cancer ([place])     There are no problems to display for this patient.   Past Surgical History:  Procedure Laterality Date  . none      Prior to Admission medications   Medication Sig Start Date End Date Taking? Authorizing Provider  ondansetron (ZOFRAN ODT) 4 MG disintegrating tablet Take 1 tablet (4 mg total) by mouth every 8 (eight) hours as needed for nausea or vomiting. 03/03/21  Yes Blake Divine, MD  oxyCODONE-acetaminophen (PERCOCET) 5-325 MG tablet Take 1 tablet by mouth every 4 (four) hours as needed for severe pain. 03/03/21 03/03/22 Yes Blake Divine, MD  tamsulosin (FLOMAX) 0.4 MG CAPS capsule Take 1 capsule (0.4 mg total) by mouth daily after supper. 03/03/21  Yes Blake Divine, MD  amLODipine (NORVASC) 5 MG tablet Take 1 tablet (5 mg total) by mouth daily. Patient not taking:  Reported on 12/19/2020 12/01/20   Lavera Guise, MD  colchicine-probenecid 0.5-500 MG tablet Take 1 tablet by mouth daily. Patient not taking: No sig reported 07/28/20   Luiz Ochoa, NP  losartan (COZAAR) 50 MG tablet Take 1 tablet (50 mg total) by mouth daily. Patient not taking: Reported on 12/19/2020 12/01/20   Lavera Guise, MD    Allergies Patient has no known allergies.  Family History  Problem Relation Age of Onset  . Diabetes Sister   . Prostate cancer Brother     Social History Social History   Tobacco Use  . Smoking status: Current Some Day Smoker    Types: Cigarettes  . Smokeless tobacco: Never Used  Vaping Use  . Vaping Use: Never used  Substance Use Topics  . Alcohol use: Yes    Alcohol/week: 1.0 standard drink    Types: 1 Cans of beer per week    Comment: every day  . Drug use: Yes    Types: Marijuana    Review of Systems  Constitutional: No fever/chills Eyes: No visual changes. ENT: No sore throat. Cardiovascular: Denies chest pain. Respiratory: Denies shortness of breath. Gastrointestinal: Positive for abdominal pain.  Positive for nausea and vomiting.  No diarrhea.  No constipation. Genitourinary: Negative for dysuria.  Positive for hematuria. Musculoskeletal: Negative for back pain. Skin: Negative for rash. Neurological: Negative for headaches, focal weakness or numbness.  ____________________________________________  PHYSICAL EXAM:  VITAL SIGNS: ED Triage Vitals  Enc Vitals Group     BP 03/03/21 1303 (!) 165/100     Pulse Rate 03/03/21 1301 60     Resp 03/03/21 1301 16     Temp 03/03/21 1301 98.6 F (37 C)     Temp Source 03/03/21 1301 Oral     SpO2 03/03/21 1301 99 %     Weight 03/03/21 1301 188 lb (85.3 kg)     Height 03/03/21 1301 5\' 8"  (1.727 m)     Head Circumference --      Peak Flow --      Pain Score 03/03/21 1301 2     Pain Loc --      Pain Edu? --      Excl. in [place]? --     Constitutional: Alert and oriented. Eyes:  Conjunctivae are normal. Head: Atraumatic. Nose: No congestion/rhinnorhea. Mouth/Throat: Mucous membranes are moist. Neck: Normal ROM Cardiovascular: Normal rate, regular rhythm. Grossly normal heart sounds. Respiratory: Normal respiratory effort.  No retractions. Lungs CTAB. Gastrointestinal: Soft and tender to palpation in the right lower quadrant with no rebound or guarding.  No CVA tenderness bilaterally. No distention. Genitourinary: deferred Musculoskeletal: No lower extremity tenderness nor edema. Neurologic:  Normal speech and language. No gross focal neurologic deficits are appreciated. Skin:  Skin is warm, dry and intact. No rash noted. Psychiatric: Mood and affect are normal. Speech and behavior are normal.  ____________________________________________   LABS (all labs ordered are listed, but only abnormal results are displayed)  Labs Reviewed  URINALYSIS, COMPLETE (UACMP) WITH MICROSCOPIC - Abnormal; Notable for the following components:      Result Value   Color, Urine RED (*)    APPearance CLOUDY (*)    Glucose, UA   (*)    Value: TEST NOT REPORTED DUE TO COLOR INTERFERENCE OF URINE PIGMENT   Hgb urine dipstick   (*)    Value: TEST NOT REPORTED DUE TO COLOR INTERFERENCE OF URINE PIGMENT   Bilirubin Urine   (*)    Value: TEST NOT REPORTED DUE TO COLOR INTERFERENCE OF URINE PIGMENT   Ketones, ur   (*)    Value: TEST NOT REPORTED DUE TO COLOR INTERFERENCE OF URINE PIGMENT   Protein, ur   (*)    Value: TEST NOT REPORTED DUE TO COLOR INTERFERENCE OF URINE PIGMENT   Nitrite   (*)    Value: TEST NOT REPORTED DUE TO COLOR INTERFERENCE OF URINE PIGMENT   Leukocytes,Ua   (*)    Value: TEST NOT REPORTED DUE TO COLOR INTERFERENCE OF URINE PIGMENT   All other components within normal limits  CBC WITH DIFFERENTIAL/PLATELET  BASIC METABOLIC PANEL     PROCEDURES  Procedure(s) performed (including Critical  Care):  Procedures   ____________________________________________   INITIAL IMPRESSION / ASSESSMENT AND PLAN / ED COURSE       70 year old male with past medical history of hypertension and prostate cancer who presents to the ED complaining of 24 hours of hematuria with pain in the right lower quadrant of his abdomen but no flank pain or dysuria.  We will check labs for any electrolyte abnormality and to assess his kidney function, UA is also pending.  Given his associated pain, we will check CT scan to assess for kidney stone.  Patient does not have any signs of urinary obstruction at this time.  Labs are reassuring, renal function within normal limits and no anemia noted.  CT scan does show  ureterolithiasis at right UVJ, likely the source of patient's hematuria.  No reason to suspect infection at this time and patient's pain is well controlled.  He is appropriate for discharge home with urology follow-up, will be prescribed pain medication and was counseled to return to the ED for any new or worsening symptoms.  Patient agrees with plan.      ____________________________________________   FINAL CLINICAL IMPRESSION(S) / ED DIAGNOSES  Final diagnoses:  Kidney stone  Gross hematuria     ED Discharge Orders         Ordered    tamsulosin (FLOMAX) 0.4 MG CAPS capsule  Daily after supper        03/03/21 1545    oxyCODONE-acetaminophen (PERCOCET) 5-325 MG tablet  Every 4 hours PRN        03/03/21 1545    ondansetron (ZOFRAN ODT) 4 MG disintegrating tablet  Every 8 hours PRN        03/03/21 1545           Note:  This document was prepared using Dragon voice recognition software and may include unintentional dictation errors.   Blake Divine, MD 03/03/21 1550

## 2021-03-09 ENCOUNTER — Other Ambulatory Visit: Payer: Self-pay

## 2021-03-09 ENCOUNTER — Ambulatory Visit (INDEPENDENT_AMBULATORY_CARE_PROVIDER_SITE_OTHER): Payer: Medicare HMO | Admitting: Urology

## 2021-03-09 VITALS — BP 169/96 | HR 61 | Ht 68.0 in | Wt 197.2 lb

## 2021-03-09 DIAGNOSIS — N200 Calculus of kidney: Secondary | ICD-10-CM

## 2021-03-09 DIAGNOSIS — C61 Malignant neoplasm of prostate: Secondary | ICD-10-CM

## 2021-03-09 LAB — URINALYSIS, COMPLETE
Bilirubin, UA: NEGATIVE
Glucose, UA: NEGATIVE
Ketones, UA: NEGATIVE
Leukocytes,UA: NEGATIVE
Nitrite, UA: NEGATIVE
Protein,UA: NEGATIVE
RBC, UA: NEGATIVE
Specific Gravity, UA: 1.02 (ref 1.005–1.030)
Urobilinogen, Ur: 0.2 mg/dL (ref 0.2–1.0)
pH, UA: 5 (ref 5.0–7.5)

## 2021-03-09 LAB — MICROSCOPIC EXAMINATION: Bacteria, UA: NONE SEEN

## 2021-03-09 NOTE — Patient Instructions (Signed)
Dietary Guidelines to Help Prevent Kidney Stones Kidney stones are deposits of minerals and salts that form inside your kidneys. Your risk of developing kidney stones may be greater depending on your diet, your lifestyle, the medicines you take, and whether you have certain medical conditions. Most people can lower their chances of developing kidney stones by following the instructions below. Your dietitian may give you more specific instructions depending on your overall health and the type of kidney stones you tend to develop. What are tips for following this plan? Reading food labels Choose foods with "no salt added" or "low-salt" labels. Limit your salt (sodium) intake to less than 1,500 mg a day. Choose foods with calcium for each meal and snack. Try to eat about 300 mg of calcium at each meal. Foods that contain 200-500 mg of calcium a serving include: 8 oz (237 mL) of milk, calcium-fortifiednon-dairy milk, and calcium-fortifiedfruit juice. Calcium-fortified means that calcium has been added to these drinks. 8 oz (237 mL) of kefir, yogurt, and soy yogurt. 4 oz (114 g) of tofu. 1 oz (28 g) of cheese. 1 cup (150 g) of dried figs. 1 cup (91 g) of cooked broccoli. One 3 oz (85 g) can of sardines or mackerel. Most people need 1,000-1,500 mg of calcium a day. Talk to your dietitian about how much calcium is recommended for you.   Shopping Buy plenty of fresh fruits and vegetables. Most people do not need to avoid fruits and vegetables, even if these foods contain nutrients that may contribute to kidney stones. When shopping for convenience foods, choose: Whole pieces of fruit. Pre-made salads with dressing on the side. Low-fat fruit and yogurt smoothies. Avoid buying frozen meals or prepared deli foods. These can be high in sodium. Look for foods with live cultures, such as yogurt and kefir. Choose high-fiber grains, such as whole-wheat breads, oat bran, and wheat cereals. Cooking Do not add  salt to food when cooking. Place a salt shaker on the table and allow each person to add his or her own salt to taste. Use vegetable protein, such as beans, textured vegetable protein (TVP), or tofu, instead of meat in pasta, casseroles, and soups. Meal planning Eat less salt, if told by your dietitian. To do this: Avoid eating processed or pre-made food. Avoid eating fast food. Eat less animal protein, including cheese, meat, poultry, or fish, if told by your dietitian. To do this: Limit the number of times you have meat, poultry, fish, or cheese each week. Eat a diet free of meat at least 2 days a week. Eat only one serving each day of meat, poultry, fish, or seafood. When you prepare animal protein, cut pieces into small portion sizes. For most meat and fish, one serving is about the size of the palm of your hand. Eat at least five servings of fresh fruits and vegetables each day. To do this: Keep fruits and vegetables on hand for snacks. Eat one piece of fruit or a handful of berries with breakfast. Have a salad and fruit at lunch. Have two kinds of vegetables at dinner. Limit foods that are high in a substance called oxalate. These include: Spinach (cooked), rhubarb, beets, sweet potatoes, and Swiss chard. Peanuts. Potato chips, french fries, and baked potatoes with skin on. Nuts and nut products. Chocolate. If you regularly take a diuretic medicine, make sure to eat at least 1 or 2 servings of fruits or vegetables that are high in potassium each day. These include: Avocado. Banana. Orange,  prune, carrot, or tomato juice. Baked potato. Cabbage. Beans and split peas. Lifestyle Drink enough fluid to keep your urine pale yellow. This is the most important thing you can do. Spread your fluid intake throughout the day. If you drink alcohol: Limit how much you use to: 0-1 drink a day for women who are not pregnant. 0-2 drinks a day for men. Be aware of how much alcohol is in your  drink. In the [place] one drink equals one 12 oz bottle of beer (355 mL), one 5 oz glass of wine (148 mL), or one 1 oz glass of hard liquor (44 mL). Lose weight if told by your health care provider. Work with your dietitian to find an eating plan and weight loss strategies that work best for you.   General information Talk to your health care provider and dietitian about taking daily supplements. You may be told the following depending on your health and the cause of your kidney stones: Not to take supplements with vitamin C. To take a calcium supplement. To take a daily probiotic supplement. To take other supplements such as magnesium, fish oil, or vitamin B6. Take over-the-counter and prescription medicines only as told by your health care provider. These include supplements. What foods should I limit? Limit your intake of the following foods, or eat them as told by your dietitian. Vegetables Spinach. Rhubarb. Beets. Canned vegetables. Alexander Cooke. Olives. Baked potatoes with skin. Grains Wheat bran. Baked goods. Salted crackers. Cereals high in sugar. Meats and other proteins Nuts. Nut butters. Large portions of meat, poultry, or fish. Salted, precooked, or cured meats, such as sausages, meat loaves, and hot dogs. Dairy Cheese. Beverages Regular soft drinks. Regular vegetable juice. Seasonings and condiments Seasoning blends with salt. Salad dressings. Soy sauce. Ketchup. Barbecue sauce. Other foods Canned soups. Canned pasta sauce. Casseroles. Pizza. Lasagna. Frozen meals. Potato chips. [place] fries. The items listed above may not be a complete list of foods and beverages you should limit. Contact a dietitian for more information. What foods should I avoid? Talk to your dietitian about specific foods you should avoid based on the type of kidney stones you have and your overall health. Fruits Grapefruit. The item listed above may not be a complete list of foods and beverages you should  avoid. Contact a dietitian for more information. Summary Kidney stones are deposits of minerals and salts that form inside your kidneys. You can lower your risk of kidney stones by making changes to your diet. The most important thing you can do is drink enough fluid. Drink enough fluid to keep your urine pale yellow. Talk to your dietitian about how much calcium you should have each day, and eat less salt and animal protein as told by your dietitian. This information is not intended to replace advice given to you by your health care provider. Make sure you discuss any questions you have with your health care provider. Document Revised: 09/10/2019 Document Reviewed: 09/10/2019 Elsevier Patient Education  2021 [employer].

## 2021-03-09 NOTE — Progress Notes (Signed)
   03/09/2021 2:07 PM   Alexander Cooke 15-Aug-1951 [id number]  Reason for visit: Follow up prostate cancer, nephrolithiasis  HPI: I saw Alexander Cooke today for the above issues.  He is a 69 year old male who was found of an elevated PSA of 6.4 and underwent a prostate biopsy on 10/21/2019 showing a 30 g prostate with a PSA density of 0.17, 4/12 cores positive for Gleason score 3+4= 7 disease with max core involvement of 12%.     He underwent external beam radiation with Dr. Baruch Gouty that was completed in May 2021.  He deferred ADT with his low-volume favorable intermediate risk disease.  His initial post radiation PSA in September 2021 was 2.09, and most recent PSA on 12/12/2020 was 1.26.  We discussed at length the importance of monitoring the PSA moving forward.  He denies any significant urinary symptoms aside from urinary frequency when he drinks large volumes of beer or alcohol.  He denies any problems with erections.  He was just recently seen in the ER on 03/03/2021 with acute onset of right-sided flank pain and significant gross hematuria.  I personally viewed and interpreted the CT that showed a 3 mm right distal ureteral stone with mild hydronephrosis, otherwise essentially benign CT.  His pain resolved after a few days, and urinalysis today is completely benign with no microscopic hematuria.  I suspect he passed his stone spontaneously.  We discussed general stone prevention strategies including adequate hydration with goal of producing 2.5 L of urine daily, increasing citric acid intake, increasing calcium intake during high oxalate meals, minimizing animal protein, and decreasing salt intake. Information about dietary recommendations given today.   Keep follow-up in September 2022 with Dr. Baruch Gouty for PSA surveillance RTC with me 1 year with [place], [place] [address] [place], [place] [address] [phone]

## 2021-04-10 ENCOUNTER — Ambulatory Visit: Payer: Medicare HMO

## 2021-04-27 ENCOUNTER — Ambulatory Visit (INDEPENDENT_AMBULATORY_CARE_PROVIDER_SITE_OTHER): Payer: Medicare HMO | Admitting: Physician Assistant

## 2021-04-27 ENCOUNTER — Other Ambulatory Visit: Payer: Self-pay

## 2021-04-27 ENCOUNTER — Encounter: Payer: Self-pay | Admitting: Physician Assistant

## 2021-04-27 DIAGNOSIS — H9191 Unspecified hearing loss, right ear: Secondary | ICD-10-CM

## 2021-04-27 DIAGNOSIS — I10 Essential (primary) hypertension: Secondary | ICD-10-CM

## 2021-04-27 DIAGNOSIS — E782 Mixed hyperlipidemia: Secondary | ICD-10-CM | POA: Diagnosis not present

## 2021-04-27 DIAGNOSIS — Z9114 Patient's other noncompliance with medication regimen: Secondary | ICD-10-CM | POA: Diagnosis not present

## 2021-04-27 MED ORDER — AMLODIPINE BESYLATE 5 MG PO TABS: 5.0000 mg | ORAL_TABLET | Freq: Every day | ORAL | 1 refills | Status: DC

## 2021-04-27 NOTE — Progress Notes (Signed)
[facility] [place], [place] [address]  Internal MEDICINE  Office Visit Note  Patient Name: Alexander Cooke  [id number]  [id number]  Date of Service: 04/27/2021  Chief Complaint  Patient presents with   Follow-up   Quality Metric Gaps    Tetanus done 5-6 years ago,cologuard    HPI Pt is here for routine follow up -BP at home 140-150/80-90. Has been without medications--reports he never received anymore and just went without. -Had a kidney stone and states this resolved -Has some ringing in his ears and has hearing loss in right ear. States he had an MRI which was negative for any cause? Reports he tried a hearing aid and states it just made noise and he preferred to go without it. ENT was following him -Sleeps ok -Declines colonoscopy or cologuard and states he does not want to undergo anymore screenings since already went through prostate cancer and doesn't want to screen for anything else.  Current Medication: Outpatient Encounter Medications as of 04/27/2021  Medication Sig   amLODipine (NORVASC) 5 MG tablet Take 1 tablet (5 mg total) by mouth daily.   [DISCONTINUED] amLODipine (NORVASC) 5 MG tablet Take 1 tablet (5 mg total) by mouth daily. (Patient not taking: No sig reported)   [DISCONTINUED] colchicine-probenecid 0.5-500 MG tablet Take 1 tablet by mouth daily. (Patient not taking: No sig reported)   [DISCONTINUED] losartan (COZAAR) 50 MG tablet Take 1 tablet (50 mg total) by mouth daily. (Patient not taking: No sig reported)   [DISCONTINUED] tamsulosin (FLOMAX) 0.4 MG CAPS capsule Take 1 capsule (0.4 mg total) by mouth daily after supper. (Patient not taking: Reported on 04/27/2021)   No facility-administered encounter medications on file as of 04/27/2021.    Surgical History: Past Surgical History:  Procedure Laterality Date   none      Medical History: Past Medical History:  Diagnosis Date   Arthritis    Hypertension    Kidney stones     Prostate cancer ([place])     Family History: Family History  Problem Relation Age of Onset   Diabetes Sister    Prostate cancer Brother     Social History   Socioeconomic History   Marital status: Married    Spouse name: Not on file   Number of children: Not on file   Years of education: Not on file   Highest education level: Not on file  Occupational History   Not on file  Tobacco Use   Smoking status: Some Days    Types: Cigarettes   Smokeless tobacco: Never  Vaping Use   Vaping Use: Never used  Substance and Sexual Activity   Alcohol use: Yes    Alcohol/week: 1.0 standard drink    Types: 1 Cans of beer per week    Comment: every day   Drug use: Yes    Types: Marijuana   Sexual activity: Yes    Birth control/protection: None  Other Topics Concern   Not on file  Social History Narrative   Not on file   Social Determinants of Health   Financial Resource Strain: Not on file  Food Insecurity: Not on file  Transportation Needs: Not on file  Physical Activity: Not on file  Stress: Not on file  Social Connections: Not on file  Intimate Partner Violence: Not on file      Review of Systems  Constitutional:  Negative for chills, fatigue and unexpected weight change.  HENT:  Negative for congestion, postnasal drip, rhinorrhea,  sneezing and sore throat.   Eyes:  Negative for redness.  Respiratory:  Negative for cough, chest tightness and shortness of breath.   Cardiovascular:  Negative for chest pain and palpitations.  Gastrointestinal:  Negative for abdominal pain, constipation, diarrhea, nausea and vomiting.  Genitourinary:  Negative for dysuria and frequency.  Musculoskeletal:  Negative for arthralgias, back pain, joint swelling and neck pain.  Skin:  Negative for rash.  Neurological: Negative.  Negative for tremors and numbness.  Hematological:  Negative for adenopathy. Does not bruise/bleed easily.  Psychiatric/Behavioral:  Negative for behavioral problems  (Depression), sleep disturbance and suicidal ideas. The patient is not nervous/anxious.    Vital Signs: BP (!) 152/96   Pulse 70   Temp (!) 97.2 F (36.2 C)   Resp 16   Ht '5\' 8"'$  (1.727 m)   Wt 192 lb 3.2 oz (87.2 kg)   SpO2 99%   BMI 29.22 kg/m    Physical Exam Vitals and nursing note reviewed.  Constitutional:      General: He is not in acute distress.    Appearance: He is well-developed. He is not diaphoretic.  HENT:     Head: Normocephalic and atraumatic.     Mouth/Throat:     Pharynx: No oropharyngeal exudate.  Eyes:     Pupils: Pupils are equal, round, and reactive to light.  Neck:     Thyroid: No thyromegaly.     Vascular: No JVD.     Trachea: No tracheal deviation.  Cardiovascular:     Rate and Rhythm: Normal rate and regular rhythm.     Heart sounds: Normal heart sounds. No murmur heard.   No friction rub. No gallop.  Pulmonary:     Effort: Pulmonary effort is normal. No respiratory distress.     Breath sounds: No wheezing or rales.  Chest:     Chest wall: No tenderness.  Abdominal:     General: Bowel sounds are normal.     Palpations: Abdomen is soft.  Musculoskeletal:        General: Normal range of motion.     Cervical back: Normal range of motion and neck supple.  Lymphadenopathy:     Cervical: No cervical adenopathy.  Skin:    General: Skin is warm and dry.  Neurological:     Mental Status: He is alert and oriented to person, place, and time.     Cranial Nerves: No cranial nerve deficit.  Psychiatric:        Behavior: Behavior normal.        Thought Content: Thought content normal.        Judgment: Judgment normal.       Assessment/Plan: 1. Essential hypertension Will restart medication after going without them for months. Will start with amlodipine and monitor closely. May need to titrate up or add lisinopril back too. - amLODipine (NORVASC) 5 MG tablet; Take 1 tablet (5 mg total) by mouth daily.  Dispense: 90 tablet; Refill: 1  2. Mixed  hyperlipidemia Continue to work on diet--pt declined statin  3. Non compliance w medication regimen Patient ran out of medication for BP and went without for months. Discussed importance of adhering to medications. Patient also refuses other recommended screenings including colon cancer screening  4. Hearing loss of right ear, unspecified hearing loss type Followed by ENT, unwilling to use hearing aid at this time   General Counseling: Rihaan verbalizes understanding of the findings of todays visit and agrees with plan of treatment. I have discussed  any further diagnostic evaluation that may be needed or ordered today. We also reviewed his medications today. he has been encouraged to call the office with any questions or concerns that should arise related to todays visit.    No orders of the defined types were placed in this encounter.   Meds ordered this encounter  Medications   amLODipine (NORVASC) 5 MG tablet    Sig: Take 1 tablet (5 mg total) by mouth daily.    Dispense:  90 tablet    Refill:  1    This patient was seen by Drema Dallas, PA-C in collaboration with Dr. Clayborn Bigness as a part of collaborative care agreement.   Total time spent:35 Minutes Time spent includes review of chart, medications, test results, and follow up plan with the patient.      Dr Lavera Guise Internal medicine

## 2021-05-29 ENCOUNTER — Ambulatory Visit: Payer: Medicare HMO | Admitting: Physician Assistant

## 2021-06-21 ENCOUNTER — Inpatient Hospital Stay: Payer: Medicare HMO | Attending: Radiation Oncology

## 2021-06-21 ENCOUNTER — Ambulatory Visit
Admission: RE | Admit: 2021-06-21 | Discharge: 2021-06-21 | Disposition: A | Discharge status: Home / Self Care | Payer: Medicare HMO | Source: Ambulatory Visit | Attending: Radiation Oncology | Admitting: Radiation Oncology

## 2021-06-21 ENCOUNTER — Other Ambulatory Visit: Payer: Self-pay | Admitting: *Deleted

## 2021-06-21 ENCOUNTER — Encounter: Payer: Self-pay | Admitting: Radiation Oncology

## 2021-06-21 VITALS — BP 138/92 | HR 70 | Temp 98.6°F | Resp 16 | Wt 193.3 lb

## 2021-06-21 DIAGNOSIS — C61 Malignant neoplasm of prostate: Secondary | ICD-10-CM | POA: Insufficient documentation

## 2021-06-21 DIAGNOSIS — Z923 Personal history of irradiation: Secondary | ICD-10-CM | POA: Diagnosis not present

## 2021-06-21 DIAGNOSIS — Z08 Encounter for follow-up examination after completed treatment for malignant neoplasm: Secondary | ICD-10-CM | POA: Diagnosis not present

## 2021-06-21 LAB — PSA: Prostatic Specific Antigen: 1 ng/mL (ref 0.00–4.00)

## 2021-06-21 NOTE — Progress Notes (Signed)
Radiation Oncology Follow up Note  Name: Alexander Cooke   Date:   06/21/2021 MRN:  [id number] DOB: Feb 23, 1951    This 70 y.o. male presents to the clinic today for 26-month follow-up status post image guided IMRT radiation therapy for Gleason 7 adenocarcinoma the prostate presenting with a PSA of 6.4..  REFERRING PROVIDER: Lavera Guise, MD  HPI: Patient is a 70 year old male now out 16 months having completed image guided IMRT radiation therapy for Gleason 7 adenocarcinoma the prostate.  Seen today in routine follow-up he is doing well.  Very low side effect profile.  No increased lower urinary tract symptoms diarrhea or fatigue.Marland Kitchen  His PSA 6 months ago was 1.26.  I am sent in for repeat PSA today.  COMPLICATIONS OF TREATMENT: none  FOLLOW UP COMPLIANCE: keeps appointments   PHYSICAL EXAM:  BP (!) 138/92   Pulse 70   Temp 98.6 F (37 C) (Tympanic)   Resp 16   Wt 193 lb 4.8 oz (87.7 kg)   BMI 29.39 kg/m  Well-developed well-nourished patient in NAD. HEENT reveals PERLA, EOMI, discs not visualized.  Oral cavity is clear. No oral mucosal lesions are identified. Neck is clear without evidence of cervical or supraclavicular adenopathy. Lungs are clear to A&P. Cardiac examination is essentially unremarkable with regular rate and rhythm without murmur rub or thrill. Abdomen is benign with no organomegaly or masses noted. Motor sensory and DTR levels are equal and symmetric in the upper and lower extremities. Cranial nerves II through XII are grossly intact. Proprioception is intact. No peripheral adenopathy or edema is identified. No motor or sensory levels are noted. Crude visual fields are within normal range.  RADIOLOGY RESULTS: No current films to review  PLAN: Present time patient is doing well from a clinical standpoint.  I sent him for repeat PSA today we will report that separately.  I have asked to see him back in 6 months for follow-up with a PSA at that time.  Patient knows to call  with any concerns.  I would like to take this opportunity to thank you for allowing me to participate in the care of your patient.Noreene Filbert, MD

## 2021-08-03 ENCOUNTER — Ambulatory Visit (INDEPENDENT_AMBULATORY_CARE_PROVIDER_SITE_OTHER): Payer: Medicare HMO | Admitting: Physician Assistant

## 2021-08-03 ENCOUNTER — Encounter: Payer: Self-pay | Admitting: Physician Assistant

## 2021-08-03 ENCOUNTER — Other Ambulatory Visit: Payer: Self-pay

## 2021-08-03 DIAGNOSIS — R5383 Other fatigue: Secondary | ICD-10-CM

## 2021-08-03 DIAGNOSIS — I10 Essential (primary) hypertension: Secondary | ICD-10-CM

## 2021-08-03 DIAGNOSIS — R300 Dysuria: Secondary | ICD-10-CM

## 2021-08-03 DIAGNOSIS — Z0001 Encounter for general adult medical examination with abnormal findings: Secondary | ICD-10-CM | POA: Diagnosis not present

## 2021-08-03 DIAGNOSIS — H9191 Unspecified hearing loss, right ear: Secondary | ICD-10-CM | POA: Diagnosis not present

## 2021-08-03 DIAGNOSIS — F1010 Alcohol abuse, uncomplicated: Secondary | ICD-10-CM

## 2021-08-03 DIAGNOSIS — E782 Mixed hyperlipidemia: Secondary | ICD-10-CM | POA: Diagnosis not present

## 2021-08-03 NOTE — Progress Notes (Signed)
[facility] [place], [place] [address]  Internal MEDICINE  Office Visit Note  Patient Name: Alexander Cooke  [id number]  [id number]  Date of Service: 08/08/2021  Chief Complaint  Patient presents with   Medicare Wellness   Hypertension     HPI Pt is here for routine health maintenance examination -Still has vertigo sometimes, maybe once per month. Still has ringing in his ears, is supposed to wear hearing aids but doesn't like them. State he had prior imaging done via ENT but doesn't want to follow up with them anymore -BP at home 329-924 systolic at the time of taking the medication -trying to stop eating too much, does exercise. -Sleeping well -Has prostate cancer follow up in sept. -He is still drinking 1/2 pint of liquor once or twice per week, sometimes will have beer too. Working to cut back further. -Declines any further screening tests including colonoscopy or cologuard -Due for routine labs  Current Medication: Outpatient Encounter Medications as of 08/03/2021  Medication Sig   amLODipine (NORVASC) 5 MG tablet Take 1 tablet (5 mg total) by mouth daily.   No facility-administered encounter medications on file as of 08/03/2021.    Surgical History: Past Surgical History:  Procedure Laterality Date   none      Medical History: Past Medical History:  Diagnosis Date   Arthritis    Hypertension    Kidney stones    Prostate cancer ([place])     Family History: Family History  Problem Relation Age of Onset   Diabetes Sister    Prostate cancer Brother       Review of Systems  Constitutional:  Negative for chills, fatigue and unexpected weight change.  HENT:  Positive for tinnitus. Negative for congestion, postnasal drip, rhinorrhea, sneezing and sore throat.   Eyes:  Negative for redness.  Respiratory:  Negative for cough, chest tightness and shortness of breath.   Cardiovascular:  Negative for chest pain and palpitations.   Gastrointestinal:  Negative for abdominal pain, constipation, diarrhea, nausea and vomiting.  Genitourinary:  Negative for dysuria and frequency.  Musculoskeletal:  Negative for arthralgias, back pain, joint swelling and neck pain.  Skin:  Negative for rash.  Neurological: Negative.  Negative for tremors and numbness.  Hematological:  Negative for adenopathy. Does not bruise/bleed easily.  Psychiatric/Behavioral:  Negative for behavioral problems (Depression), sleep disturbance and suicidal ideas. The patient is not nervous/anxious.     Vital Signs: BP 130/84   Pulse 64   Temp 98 F (36.7 C)   Resp 16   Ht 5\' 8"  (1.727 m)   Wt 192 lb (87.1 kg)   SpO2 94%   BMI 29.19 kg/m    Physical Exam Vitals and nursing note reviewed.  Constitutional:      General: He is not in acute distress.    Appearance: He is well-developed. He is not diaphoretic.  HENT:     Head: Normocephalic and atraumatic.     Mouth/Throat:     Pharynx: No oropharyngeal exudate.  Eyes:     Pupils: Pupils are equal, round, and reactive to light.  Neck:     Thyroid: No thyromegaly.     Vascular: No JVD.     Trachea: No tracheal deviation.  Cardiovascular:     Rate and Rhythm: Normal rate and regular rhythm.     Heart sounds: Normal heart sounds. No murmur heard.   No friction rub. No gallop.  Pulmonary:     Effort: Pulmonary effort  is normal. No respiratory distress.     Breath sounds: No wheezing or rales.  Chest:     Chest wall: No tenderness.  Abdominal:     General: Bowel sounds are normal.     Palpations: Abdomen is soft.  Musculoskeletal:        General: Normal range of motion.     Cervical back: Normal range of motion and neck supple.  Lymphadenopathy:     Cervical: No cervical adenopathy.  Skin:    General: Skin is warm and dry.  Neurological:     Mental Status: He is alert and oriented to person, place, and time.     Cranial Nerves: No cranial nerve deficit.  Psychiatric:         Behavior: Behavior normal.        Thought Content: Thought content normal.        Judgment: Judgment normal.     LABS: Recent Results (from the past 2160 hour(s))  PSA     Status: None   Collection Time: 06/21/21 11:41 AM  Result Value Ref Range   Prostatic Specific Antigen 1.00 0.00 - 4.00 ng/mL    Comment: (NOTE) While PSA levels of <=4.0 ng/ml are reported as reference range, some men with levels below 4.0 ng/ml can have prostate cancer and many men with PSA above 4.0 ng/ml do not have prostate cancer.  Other tests such as free PSA, age specific reference ranges, PSA velocity and PSA doubling time may be helpful especially in men less than 75 years old. Performed at [facility], [place] [address] [place], [place] [address]   UA/M w/rflx Culture, Routine     Status: Abnormal   Collection Time: 08/03/21  4:44 PM   Specimen: Urine   Urine  Result Value Ref Range   Specific Gravity, UA 1.021 1.005 - 1.030   pH, UA 5.0 5.0 - 7.5   Color, UA Yellow Yellow   Appearance Ur Turbid (A) Clear   Leukocytes,UA Negative Negative   Protein,UA Trace Negative/Trace   Glucose, UA Negative Negative   Ketones, UA Negative Negative   RBC, UA Negative Negative   Bilirubin, UA Negative Negative   Urobilinogen, Ur 0.2 0.2 - 1.0 mg/dL   Nitrite, UA Negative Negative   Microscopic Examination Comment     Comment: Microscopic follows if indicated.   Microscopic Examination See below:     Comment: Microscopic was indicated and was performed.   Urinalysis Reflex Comment     Comment: This specimen will not reflex to a Urine Culture.  Microscopic Examination     Status: None   Collection Time: 08/03/21  4:44 PM   Urine  Result Value Ref Range   WBC, UA None seen 0 - 5 /hpf   RBC None seen 0 - 2 /hpf   Epithelial Cells (non renal) 0-10 0 - 10 /hpf   Casts None seen None seen /lpf   Bacteria, UA None seen None seen/Few        Assessment/Plan: 1. Encounter for general adult medical  examination with abnormal findings CPE performed, routine labs ordered, patient declines further screening tests including colonoscopy/Cologuard  2. Essential hypertension Stable, continue current medication  3. Mixed hyperlipidemia Will recheck labs  4. Alcohol consumption binge drinking Counseled on limiting alcohol use.  Patient is working to cut back but does still consume significant amount throughout the week patient states he will continue to cut back further and understands potential consequences of continued heavy alcohol  use  5. Hearing loss of right ear, unspecified hearing loss type Loss with tinnitus previously followed by ENT however patient states he is no longer following with ENT and declines wearing hearing aid.  Advised to follow back up with ENT  6. Other fatigue - CBC w/Diff/Platelet - Comprehensive metabolic panel - TSH + free T4 - Lipid Panel With LDL/HDL Ratio  7. Dysuria - UA/M w/rflx Culture, Routine   General Counseling: Jaymond verbalizes understanding of the findings of todays visit and agrees with plan of treatment. I have discussed any further diagnostic evaluation that may be needed or ordered today. We also reviewed his medications today. he has been encouraged to call the office with any questions or concerns that should arise related to todays visit.    Counseling:    Orders Placed This Encounter  Procedures   Microscopic Examination   UA/M w/rflx Culture, Routine   CBC w/Diff/Platelet   Comprehensive metabolic panel   TSH + free T4   Lipid Panel With LDL/HDL Ratio    No orders of the defined types were placed in this encounter.   This patient was seen by Drema Dallas, PA-C in collaboration with Dr. Clayborn Bigness as a part of collaborative care agreement.  Total time spent:35 Minutes  Time spent includes review of chart, medications, test results, and follow up plan with the patient.     Lavera Guise, MD  Internal  Medicine

## 2021-08-04 LAB — UA/M W/RFLX CULTURE, ROUTINE
Bilirubin, UA: NEGATIVE
Glucose, UA: NEGATIVE
Ketones, UA: NEGATIVE
Leukocytes,UA: NEGATIVE
Nitrite, UA: NEGATIVE
RBC, UA: NEGATIVE
Specific Gravity, UA: 1.021 (ref 1.005–1.030)
Urobilinogen, Ur: 0.2 mg/dL (ref 0.2–1.0)
pH, UA: 5 (ref 5.0–7.5)

## 2021-08-04 LAB — MICROSCOPIC EXAMINATION
Bacteria, UA: NONE SEEN
Casts: NONE SEEN /lpf
RBC, Urine: NONE SEEN /hpf (ref 0–2)
WBC, UA: NONE SEEN /hpf (ref 0–5)

## 2021-08-14 DIAGNOSIS — R5383 Other fatigue: Secondary | ICD-10-CM | POA: Diagnosis not present

## 2021-08-15 LAB — COMPREHENSIVE METABOLIC PANEL
ALT: 28 IU/L (ref 0–44)
AST: 30 IU/L (ref 0–40)
Albumin/Globulin Ratio: 1.6 (ref 1.2–2.2)
Albumin: 4.5 g/dL (ref 3.8–4.8)
Alkaline Phosphatase: 112 IU/L (ref 44–121)
BUN/Creatinine Ratio: 10 (ref 10–24)
BUN: 12 mg/dL (ref 8–27)
Bilirubin Total: 0.8 mg/dL (ref 0.0–1.2)
CO2: 19 mmol/L — ABNORMAL LOW (ref 20–29)
Calcium: 9.6 mg/dL (ref 8.6–10.2)
Chloride: 106 mmol/L (ref 96–106)
Creatinine, Ser: 1.26 mg/dL (ref 0.76–1.27)
Globulin, Total: 2.9 g/dL (ref 1.5–4.5)
Glucose: 93 mg/dL (ref 70–99)
Potassium: 4.2 mmol/L (ref 3.5–5.2)
Sodium: 143 mmol/L (ref 134–144)
Total Protein: 7.4 g/dL (ref 6.0–8.5)
eGFR: 61 mL/min/{1.73_m2} (ref >59)

## 2021-08-15 LAB — CBC WITH DIFFERENTIAL/PLATELET
Basophils Absolute: 0 10*3/uL (ref 0.0–0.2)
Basos: 1 %
EOS (ABSOLUTE): 0.1 10*3/uL (ref 0.0–0.4)
Eos: 2 %
Hematocrit: 47.2 % (ref 37.5–51.0)
Hemoglobin: 16.5 g/dL (ref 13.0–17.7)
Immature Grans (Abs): 0 10*3/uL (ref 0.0–0.1)
Immature Granulocytes: 0 %
Lymphocytes Absolute: 1.6 10*3/uL (ref 0.7–3.1)
Lymphs: 23 %
MCH: 31.2 pg (ref 26.6–33.0)
MCHC: 35 g/dL (ref 31.5–35.7)
MCV: 89 fL (ref 79–97)
Monocytes Absolute: 0.4 10*3/uL (ref 0.1–0.9)
Monocytes: 6 %
Neutrophils Absolute: 4.8 10*3/uL (ref 1.4–7.0)
Neutrophils: 68 %
Platelets: 215 10*3/uL (ref 150–450)
RBC: 5.29 x10E6/uL (ref 4.14–5.80)
RDW: 12.4 % (ref 11.6–15.4)
WBC: 7 10*3/uL (ref 3.4–10.8)

## 2021-08-15 LAB — LIPID PANEL WITH LDL/HDL RATIO
Cholesterol, Total: 173 mg/dL (ref 100–199)
HDL: 60 mg/dL (ref >39)
LDL Chol Calc (NIH): 76 mg/dL (ref 0–99)
LDL/HDL Ratio: 1.3 ratio (ref 0.0–3.6)
Triglycerides: 224 mg/dL — ABNORMAL HIGH (ref 0–149)
VLDL Cholesterol Cal: 37 mg/dL (ref 5–40)

## 2021-08-15 LAB — TSH+FREE T4
Free T4: 1.12 ng/dL (ref 0.82–1.77)
TSH: 0.894 u[IU]/mL (ref 0.450–4.500)

## 2021-08-18 ENCOUNTER — Telehealth: Payer: Self-pay

## 2021-08-18 NOTE — Telephone Encounter (Signed)
-----   Message from Corlis Hove sent at 08/18/2021  1:47 PM EST -----  ----- Message ----- From: Mylinda Latina, PA-C Sent: 08/16/2021   1:57 PM EST To: Edd Arbour, CMA  Please let him know his labs looked good overall, his TG were a little high and he should would on improving diet

## 2021-08-18 NOTE — Telephone Encounter (Signed)
Called pt back and informed him of his labs and that his triglycerides were a little high and to work on his diet watching his greasy and fatty foods

## 2021-08-21 ENCOUNTER — Telehealth: Payer: Self-pay

## 2021-08-21 NOTE — Progress Notes (Signed)
Pt spoke with Desha and got results

## 2021-08-21 NOTE — Telephone Encounter (Signed)
-----   Message from Corlis Hove sent at 08/18/2021  1:47 PM EST -----  ----- Message ----- From: Mylinda Latina, PA-C Sent: 08/16/2021   1:57 PM EST To: Edd Arbour, CMA  Please let him know his labs looked good overall, his TG were a little high and he should would on improving diet

## 2021-11-08 ENCOUNTER — Other Ambulatory Visit: Payer: Self-pay | Admitting: Physician Assistant

## 2021-11-08 DIAGNOSIS — I10 Essential (primary) hypertension: Secondary | ICD-10-CM

## 2021-12-09 IMAGING — MR MR BRAIN/IAC WO/W CM
10 of 15 series · 28 of 48 positions shown · IV contrast (gadavist)
Comparison: Head CT December 23, 2013.

CLINICAL DATA: Sudden hearing loss.

EXAM:
MRI HEAD WITHOUT AND WITH CONTRAST
TECHNIQUE: Multiplanar, multiecho pulse sequences of the brain and surrounding
structures were obtained without and with intravenous contrast.
CONTRAST:  9mL GADAVIST GADOBUTROL 1 MMOL/ML IV SOLN

[Series 5: T1 · sagittal · 5.0mm · 0.62mm/px · 1 of 25 slices shown (1 of 3)]
[im 1/25]
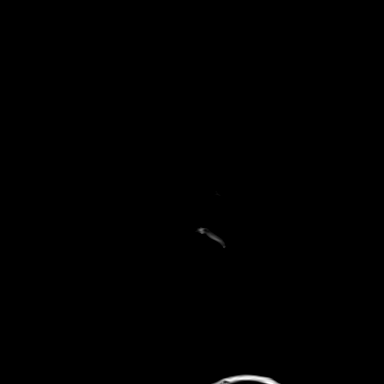

[Series 6: ax dwi_tracew · axial · 3.0mm · 0.60mm/px · z∈[-129,+25]mm · 6 of 96 slices shown]
[im 1/96]
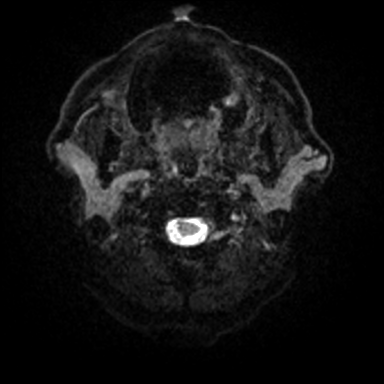
[im 20/96]
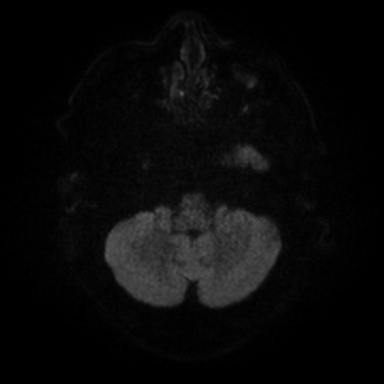
[im 39/96]
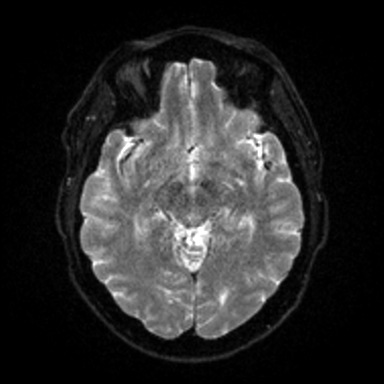
[im 58/96]
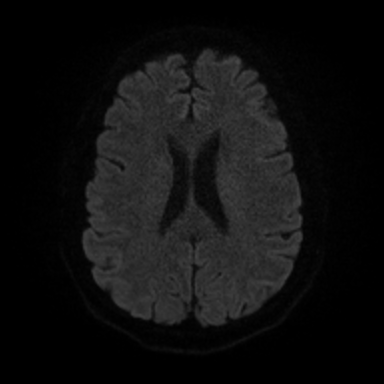
[im 77/96]
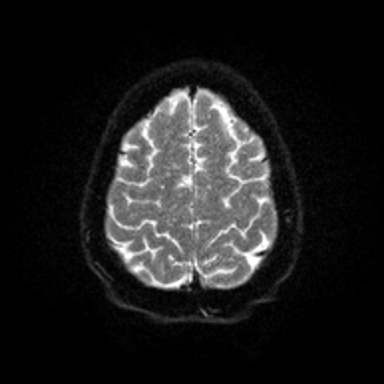
[im 96/96]
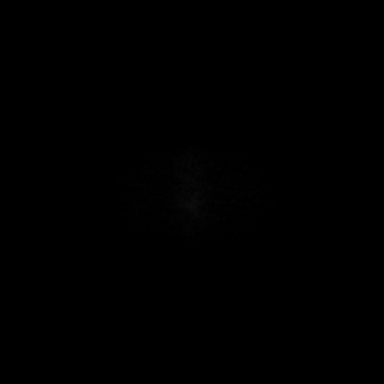

[Series 7: ax dwi_adc · axial · 3.0mm · 0.60mm/px · z∈[-129,-53]mm · 2 of 47 slices shown]
[im 1/47]
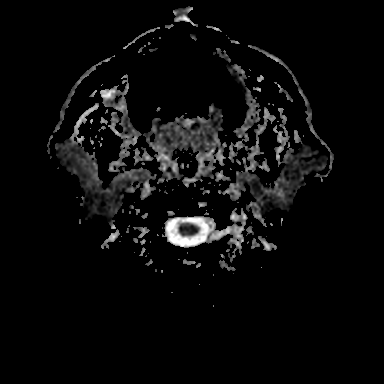
[im 24/47]
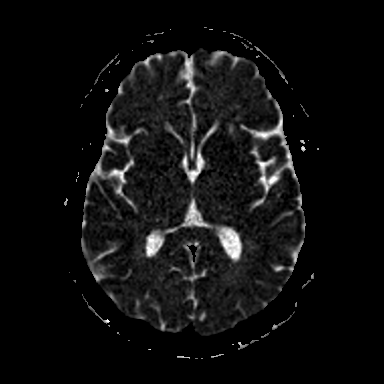

[Series 8: T2 · axial · 5.0mm · 0.53mm/px · 1 of 24 slices shown]
[im 1/24]
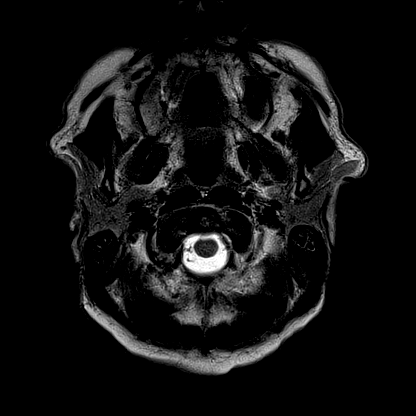

[Series 13: FLAIR · axial · 3.0mm · 0.53mm/px · z∈[-132,+29]mm · 3 of 55 slices shown]
[im 1/55]
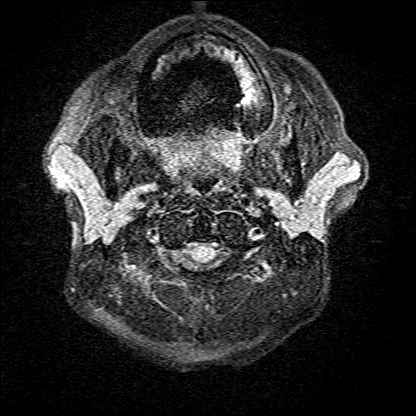
[im 28/55]
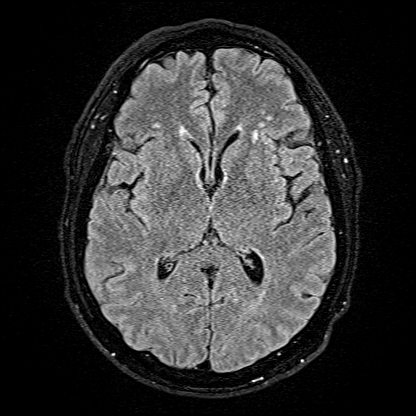
[im 55/55]
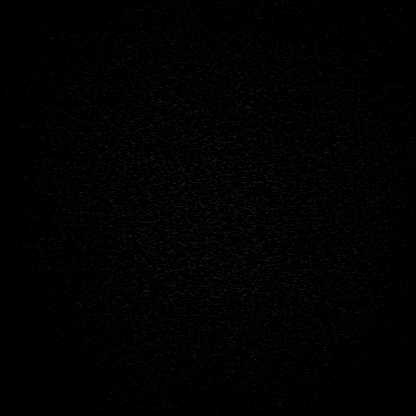

[Series 14: T1 · coronal · non-contrast · 3.0mm · 0.21mm/px · 1 of 13 slices shown (2 of 3)]
[im 1/13]
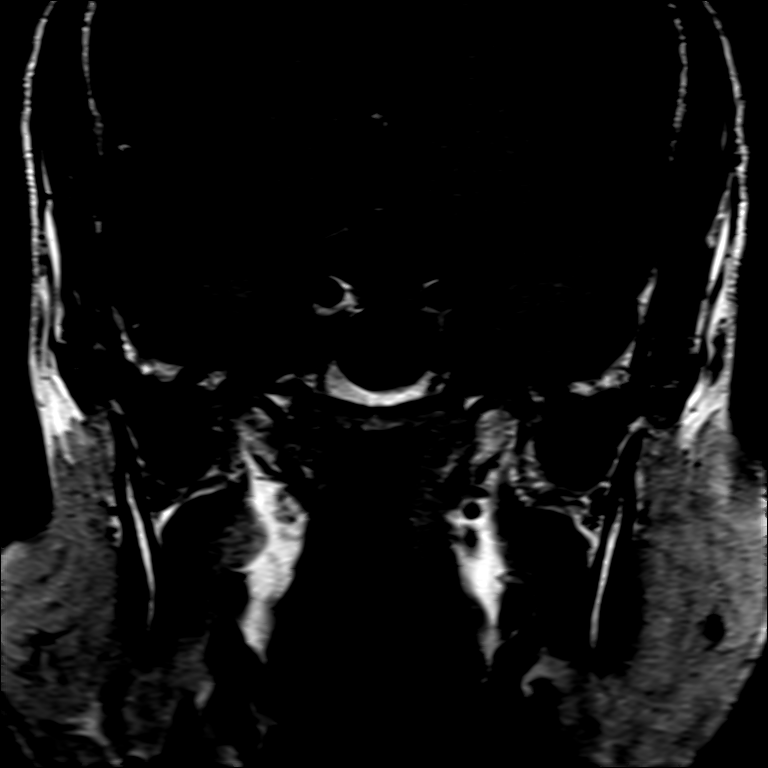

[Series 16: T1 · axial · non-contrast · 3.0mm · 0.21mm/px · 1 of 15 slices shown (3 of 3)]
[im 1/15]
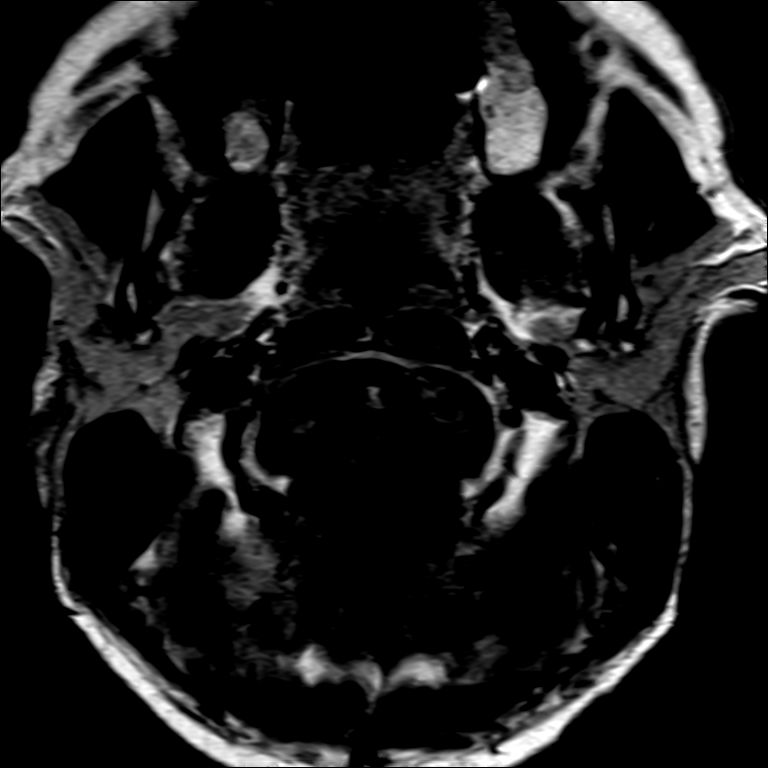

[Series 17: T1 post-contrast · axial · 3.0mm · 0.21mm/px · 1 of 15 slices shown (1 of 3)]
[im 1/15]
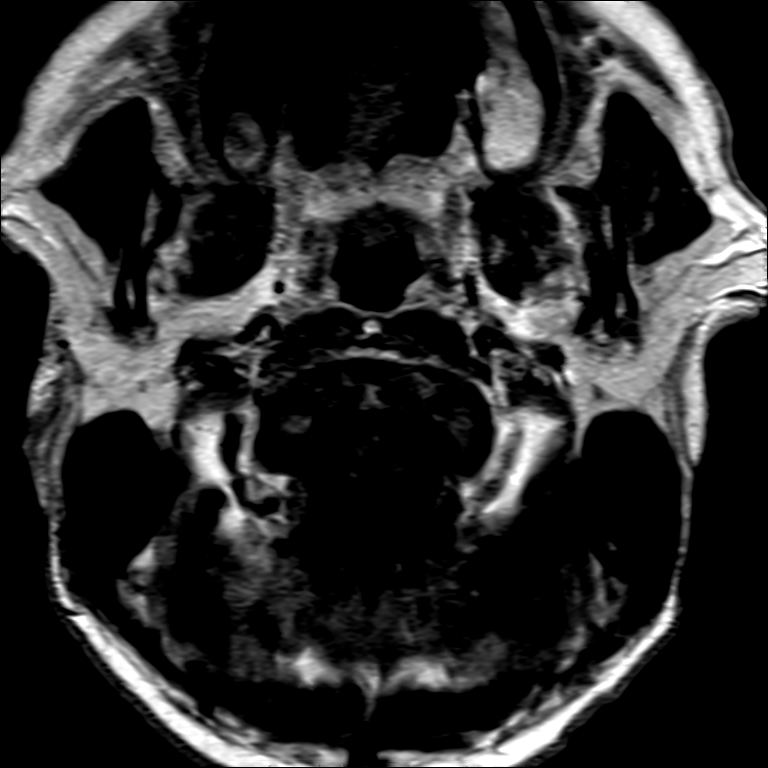

[Series 18: T1 post-contrast · coronal · 3.0mm · 0.21mm/px · 1 of 13 slices shown (2 of 3)]
[im 1/13]
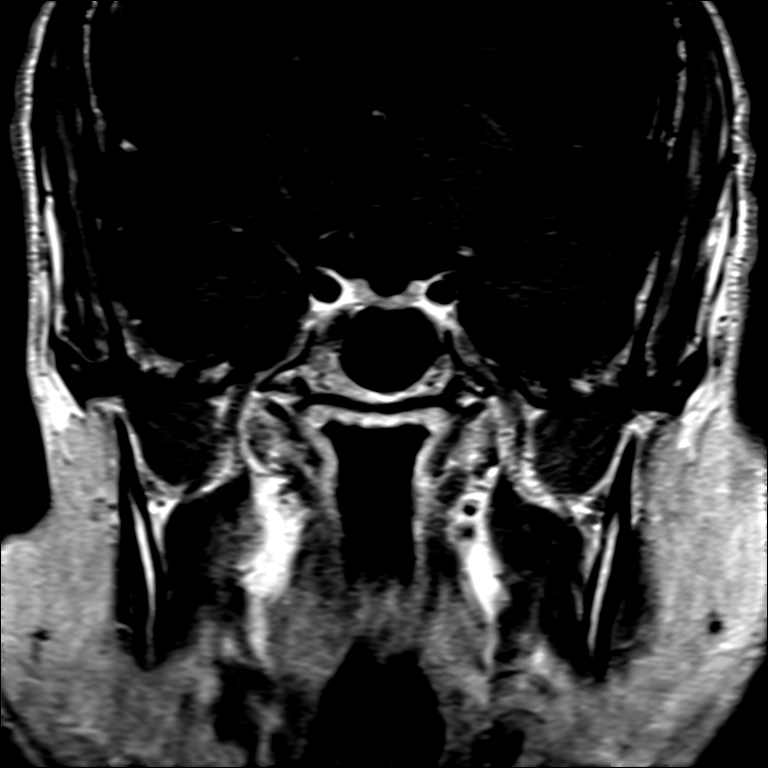

[Series 19: T1 post-contrast · axial · 1.0mm · 0.98mm/px · z∈[-137,+36]mm · 11 of 176 slices shown (3 of 3)]
[im 1/176]
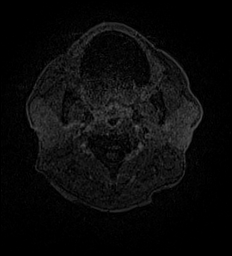
[im 18/176]
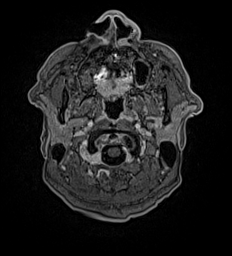
[im 36/176]
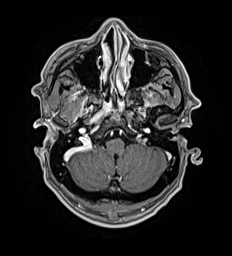
[im 53/176]
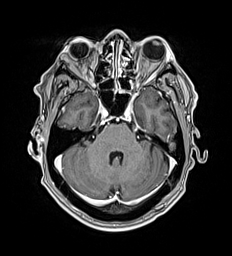
[im 71/176]
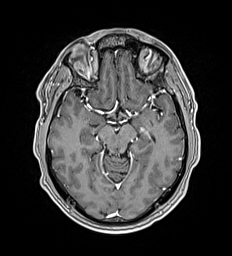
[im 88/176]
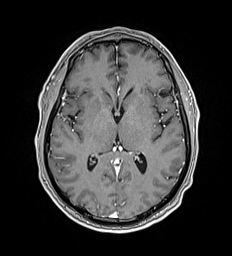
[im 106/176]
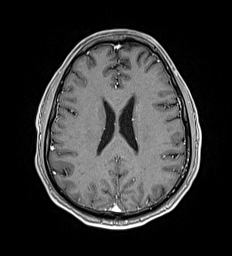
[im 123/176]
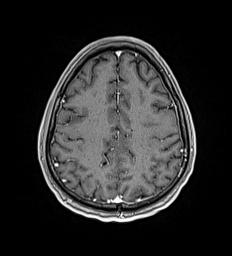
[im 141/176]
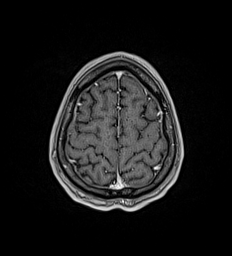
[im 158/176]
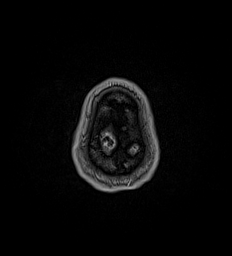
[im 176/176]
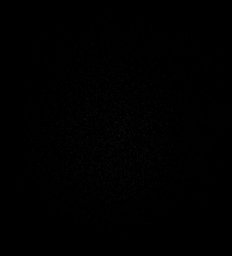

[28 of 48 positions shown; findings below may reference images not displayed]

FINDINGS: Brain: No acute infarction, hemorrhage, hydrocephalus or extra-axial
collection.

Scattered foci of T2 hyperintensity are seen within the white matter
of the cerebral hemispheres, nonspecific.

A 3 mm focus of contrast enhancement is seen within the left
internal auditory canal fundus, better appreciated on series 18,
image 6 and series 19, image 47. Right cranial nerves 7 and 8 and
right internal auditory canal appear normal.

Vascular: Normal flow voids.

Skull and upper cervical spine: Normal marrow signal.

Sinuses/Orbits: Negative.

Other: Minimal bilateral mastoid effusion.
IMPRESSION: 1. A 3 mm focus of contrast enhancement within the left internal
auditory canal fundus, which may represent a small vestibular
schwannoma.
2. T2 hyperintense white matter lesions, nonspecific, may represent
mild chronic microvascular ischemic changes.

## 2021-12-13 ENCOUNTER — Other Ambulatory Visit: Payer: Self-pay

## 2021-12-13 ENCOUNTER — Inpatient Hospital Stay: Payer: Medicare HMO | Attending: Radiation Oncology

## 2021-12-13 DIAGNOSIS — C61 Malignant neoplasm of prostate: Secondary | ICD-10-CM | POA: Diagnosis not present

## 2021-12-13 LAB — PSA: Prostatic Specific Antigen: 0.7 ng/mL (ref 0.00–4.00)

## 2021-12-20 ENCOUNTER — Ambulatory Visit
Admission: RE | Admit: 2021-12-20 | Discharge: 2021-12-20 | Disposition: A | Discharge status: Home / Self Care | Payer: Medicare HMO | Source: Ambulatory Visit | Attending: Radiation Oncology | Admitting: Radiation Oncology

## 2021-12-20 ENCOUNTER — Encounter: Payer: Self-pay | Admitting: Radiation Oncology

## 2021-12-20 ENCOUNTER — Other Ambulatory Visit: Payer: Self-pay

## 2021-12-20 VITALS — BP 143/91 | HR 63 | Temp 96.0°F | Resp 16 | Ht 67.0 in | Wt 190.3 lb

## 2021-12-20 DIAGNOSIS — Z87442 Personal history of urinary calculi: Secondary | ICD-10-CM | POA: Insufficient documentation

## 2021-12-20 DIAGNOSIS — C61 Malignant neoplasm of prostate: Secondary | ICD-10-CM | POA: Diagnosis not present

## 2021-12-20 DIAGNOSIS — Z923 Personal history of irradiation: Secondary | ICD-10-CM | POA: Diagnosis not present

## 2021-12-20 DIAGNOSIS — R351 Nocturia: Secondary | ICD-10-CM | POA: Diagnosis not present

## 2021-12-20 DIAGNOSIS — Z08 Encounter for follow-up examination after completed treatment for malignant neoplasm: Secondary | ICD-10-CM | POA: Diagnosis not present

## 2021-12-20 MED ORDER — TAMSULOSIN HCL 0.4 MG PO CAPS: 0.4000 mg | ORAL_CAPSULE | Freq: Every day | ORAL | 4 refills | Status: DC

## 2021-12-20 NOTE — Progress Notes (Signed)
Radiation Oncology ?Follow up Note ? ?Name: Alexander Cooke   ?Date:   12/20/2021 ?MRN:  [id number] ?DOB: 11/28/1950  ? ? ?This 71 y.o. male presents to the clinic today for 2-year follow-up status post image guided IMRT radiation therapy for Gleason 7 adenocarcinoma the prostate presenting with a PSA of 6.4. ? ?REFERRING PROVIDER: Lavera Guise, MD ? ?HPI: Patient is a 71 year old male now out 2 years having completed image guided IMRT radiation therapy for a Gleason 7 adenocarcinoma the prostate.  He is seen today in routine follow-up doing well.  Patient is having some urgency frequency and nocturia x4.  He is on not on medication for that.  His most recent PSA is 0.7 continuing a downward trend from 6 months ago of 1.0 and 1.26 a year ago.  He had a recent renal calculi which was seen on CT scan.  I have reviewed the scan showing no evidence of progressive disease in the region of the prostate. ?COMPLICATIONS OF TREATMENT: none ? ?FOLLOW UP COMPLIANCE: keeps appointments  ? ?PHYSICAL EXAM:  ?BP (!) 143/91 (BP Location: Left Arm, Patient Position: Sitting)   Pulse 63   Temp (!) 96 ?F (35.6 ?C) (Tympanic)   Resp 16   Ht '5\' 7"'$  (1.702 m)   Wt 190 lb 4.8 oz (86.3 kg)   BMI 29.81 kg/m?  ?Well-developed well-nourished patient in NAD. HEENT reveals PERLA, EOMI, discs not visualized.  Oral cavity is clear. No oral mucosal lesions are identified. Neck is clear without evidence of cervical or supraclavicular adenopathy. Lungs are clear to A&P. Cardiac examination is essentially unremarkable with regular rate and rhythm without murmur rub or thrill. Abdomen is benign with no organomegaly or masses noted. Motor sensory and DTR levels are equal and symmetric in the upper and lower extremities. Cranial nerves II through XII are grossly intact. Proprioception is intact. No peripheral adenopathy or edema is identified. No motor or sensory levels are noted. Crude visual fields are within normal range. ? ?RADIOLOGY RESULTS: CT  scan reviewed compatible with above-stated findings ? ?PLAN: Present time patient is under excellent biochemical control of his prostate cancer.  I Georgina Peer try him on Flomax to see if this improves some of his lower urinary tract symptoms.  I have also asked to see him back in 1 year for follow-up with a PSA at that time.  Patient knows to call with any concerns. ? ?I would like to take this opportunity to thank you for allowing me to participate in the care of your patient.. ?  ? Noreene Filbert, MD ? ?

## 2021-12-27 ENCOUNTER — Other Ambulatory Visit: Payer: Self-pay | Admitting: *Deleted

## 2021-12-27 ENCOUNTER — Telehealth: Payer: Self-pay | Admitting: *Deleted

## 2021-12-27 MED ORDER — TAMSULOSIN HCL 0.4 MG PO CAPS: 0.4000 mg | ORAL_CAPSULE | Freq: Every day | ORAL | 4 refills | Status: DC

## 2021-12-27 NOTE — Telephone Encounter (Signed)
error 

## 2022-02-01 ENCOUNTER — Ambulatory Visit (INDEPENDENT_AMBULATORY_CARE_PROVIDER_SITE_OTHER): Payer: Medicare HMO | Admitting: Physician Assistant

## 2022-02-01 ENCOUNTER — Encounter: Payer: Self-pay | Admitting: Physician Assistant

## 2022-02-01 VITALS — BP 136/84 | HR 72 | Temp 97.8°F | Resp 16 | Ht 68.0 in | Wt 187.6 lb

## 2022-02-01 DIAGNOSIS — I10 Essential (primary) hypertension: Secondary | ICD-10-CM

## 2022-02-01 DIAGNOSIS — C61 Malignant neoplasm of prostate: Secondary | ICD-10-CM

## 2022-02-01 DIAGNOSIS — E782 Mixed hyperlipidemia: Secondary | ICD-10-CM | POA: Diagnosis not present

## 2022-02-01 DIAGNOSIS — F1010 Alcohol abuse, uncomplicated: Secondary | ICD-10-CM | POA: Diagnosis not present

## 2022-02-01 NOTE — Progress Notes (Signed)
[facility] ?[address] ?[place], [place] [address] ? ?Internal MEDICINE  ?Office Visit Note ? ?Patient Name: Alexander Cooke ? [id number]  ?[id number] ? ?Date of Service: 02/04/2022 ? ?Chief Complaint  ?Patient presents with  ? Follow-up  ? Hypertension  ? ? ?HPI ?Pt is here for routine follow up and has no concerns today ?-He reports he had a urology follow up visit and was told everything looked good in regards to his prostate cancer follow up and will be seen annually ?-BP has been 135/88 at home ?-Does continue to drink a few times per week ?-is exercising regularly ? ?Current Medication: ?Outpatient Encounter Medications as of 02/01/2022  ?Medication Sig  ? amLODipine (NORVASC) 5 MG tablet TAKE 1 TABLET EVERY DAY  ? tamsulosin (FLOMAX) 0.4 MG CAPS capsule Take 1 capsule (0.4 mg total) by mouth daily after supper.  ? tamsulosin (FLOMAX) 0.4 MG CAPS capsule Take 1 capsule (0.4 mg total) by mouth daily after supper.  ? ?No facility-administered encounter medications on file as of 02/01/2022.  ? ? ?Surgical History: ?Past Surgical History:  ?Procedure Laterality Date  ? none    ? ? ?Medical History: ?Past Medical History:  ?Diagnosis Date  ? Arthritis   ? Hypertension   ? Kidney stones   ? Prostate cancer ([place])   ? ? ?Family History: ?Family History  ?Problem Relation Age of Onset  ? Diabetes Sister   ? Prostate cancer Brother   ? ? ?Social History  ? ?Socioeconomic History  ? Marital status: Married  ?  Spouse name: Not on file  ? Number of children: Not on file  ? Years of education: Not on file  ? Highest education level: Not on file  ?Occupational History  ? Not on file  ?Tobacco Use  ? Smoking status: Some Days  ?  Types: Cigarettes  ? Smokeless tobacco: Never  ?Vaping Use  ? Vaping Use: Never used  ?Substance and Sexual Activity  ? Alcohol use: Yes  ?  Alcohol/week: 1.0 standard drink  ?  Types: 1 Cans of beer per week  ?  Comment: every day  ? Drug use: Yes  ?  Types: Marijuana  ? Sexual activity: Yes   ?  Birth control/protection: None  ?Other Topics Concern  ? Not on file  ?Social History Narrative  ? Not on file  ? ?Social Determinants of Health  ? ?Financial Resource Strain: Not on file  ?Food Insecurity: Not on file  ?Transportation Needs: Not on file  ?Physical Activity: Not on file  ?Stress: Not on file  ?Social Connections: Not on file  ?Intimate Partner Violence: Not on file  ? ? ? ? ?Review of Systems  ?Constitutional:  Negative for chills, fatigue and unexpected weight change.  ?HENT:  Negative for congestion, postnasal drip, rhinorrhea, sneezing and sore throat.   ?Eyes:  Negative for redness.  ?Respiratory:  Negative for cough, chest tightness and shortness of breath.   ?Cardiovascular:  Negative for chest pain and palpitations.  ?Gastrointestinal:  Negative for abdominal pain, constipation, diarrhea, nausea and vomiting.  ?Genitourinary:  Negative for dysuria and frequency.  ?Musculoskeletal:  Negative for arthralgias, back pain, joint swelling and neck pain.  ?Skin:  Negative for rash.  ?Neurological: Negative.  Negative for tremors and numbness.  ?Hematological:  Negative for adenopathy. Does not bruise/bleed easily.  ?Psychiatric/Behavioral:  Negative for behavioral problems (Depression), sleep disturbance and suicidal ideas. The patient is not nervous/anxious.   ? ?Vital Signs: ?BP 136/84 Comment:  131/91  Pulse 72   Temp 97.8 ?F (36.6 ?C)   Resp 16   Ht '5\' 8"'$  (1.727 m)   Wt 187 lb 9.6 oz (85.1 kg)   SpO2 98%   BMI 28.52 kg/m?  ? ? ?Physical Exam ?Vitals and nursing note reviewed.  ?Constitutional:   ?   General: He is not in acute distress. ?   Appearance: He is well-developed. He is not diaphoretic.  ?HENT:  ?   Head: Normocephalic and atraumatic.  ?   Mouth/Throat:  ?   Pharynx: No oropharyngeal exudate.  ?Eyes:  ?   Pupils: Pupils are equal, round, and reactive to light.  ?Neck:  ?   Thyroid: No thyromegaly.  ?   Vascular: No JVD.  ?   Trachea: No tracheal deviation.  ?Cardiovascular:   ?   Rate and Rhythm: Normal rate and regular rhythm.  ?   Heart sounds: Normal heart sounds. No murmur heard. ?  No friction rub. No gallop.  ?Pulmonary:  ?   Effort: Pulmonary effort is normal. No respiratory distress.  ?   Breath sounds: No wheezing or rales.  ?Chest:  ?   Chest wall: No tenderness.  ?Abdominal:  ?   General: Bowel sounds are normal.  ?   Palpations: Abdomen is soft.  ?Musculoskeletal:     ?   General: Normal range of motion.  ?   Cervical back: Normal range of motion and neck supple.  ?Lymphadenopathy:  ?   Cervical: No cervical adenopathy.  ?Skin: ?   General: Skin is warm and dry.  ?Neurological:  ?   Mental Status: He is alert and oriented to person, place, and time.  ?   Cranial Nerves: No cranial nerve deficit.  ?Psychiatric:     ?   Behavior: Behavior normal.     ?   Thought Content: Thought content normal.     ?   Judgment: Judgment normal.  ? ? ? ? ? ?Assessment/Plan: ?1. Essential hypertension ?Stable, continue current medication ? ?2. Mixed hyperlipidemia ?Continue to work on diet and exercise ? ?3. Alcohol consumption binge drinking ?Again discussed avoiding alcohol consumption to the point of binge drinking and to continue to reduce intake ? ?4.  Prostate cancer ?Followed by urology with updated report showing significant reduction of PSA and stabilization of patient since diagnosis, requiring only annual follow-up for continued monitoring ? ?General Counseling: Alexander Cooke verbalizes understanding of the findings of todays visit and agrees with plan of treatment. I have discussed any further diagnostic evaluation that may be needed or ordered today. We also reviewed his medications today. he has been encouraged to call the office with any questions or concerns that should arise related to todays visit. ? ? ? ?No orders of the defined types were placed in this encounter. ? ? ?No orders of the defined types were placed in this encounter. ? ? ?This patient was seen by Drema Dallas, PA-C  in collaboration with Dr. Clayborn Bigness as a part of collaborative care agreement. ? ? ?Total time spent:30 Minutes ?Time spent includes review of chart, medications, test results, and follow up plan with the patient.  ? ? ? ? ?Dr Lavera Guise ?Internal medicine  ?

## 2022-03-02 ENCOUNTER — Other Ambulatory Visit: Payer: Medicare HMO

## 2022-03-02 DIAGNOSIS — C61 Malignant neoplasm of prostate: Secondary | ICD-10-CM | POA: Diagnosis not present

## 2022-03-03 LAB — PSA: Prostate Specific Ag, Serum: 1 ng/mL (ref 0.0–4.0)

## 2022-03-08 ENCOUNTER — Encounter: Payer: Self-pay | Admitting: Urology

## 2022-03-08 ENCOUNTER — Ambulatory Visit (INDEPENDENT_AMBULATORY_CARE_PROVIDER_SITE_OTHER): Payer: Medicare HMO | Admitting: Urology

## 2022-03-08 VITALS — BP 152/94 | HR 71 | Ht 67.0 in | Wt 185.0 lb

## 2022-03-08 DIAGNOSIS — Z8546 Personal history of malignant neoplasm of prostate: Secondary | ICD-10-CM

## 2022-03-08 DIAGNOSIS — C61 Malignant neoplasm of prostate: Secondary | ICD-10-CM

## 2022-03-08 DIAGNOSIS — Z87442 Personal history of urinary calculi: Secondary | ICD-10-CM | POA: Diagnosis not present

## 2022-03-08 DIAGNOSIS — N200 Calculus of kidney: Secondary | ICD-10-CM

## 2022-03-08 NOTE — Progress Notes (Signed)
   03/08/2022 4:20 PM   Alexander Cooke September 13, 1951 [id number]  Reason for visit: Follow up prostate cancer, nephrolithiasis  HPI: I saw Alexander Cooke today for the above issues.  He is a 71 year old male who was found of an elevated PSA of 6.4 and underwent a prostate biopsy on 10/21/2019 showing a 30 g prostate with a PSA density of 0.17, 4/12 cores positive for Gleason score 3+4= 7 disease with max core involvement of 12%.     He underwent external beam radiation with Dr. Baruch Gouty that was completed in May 2021.  He deferred ADT with his low-volume favorable intermediate risk disease.  His post radiation PSA in September 2021 was 2.09, 1.26 in March 2022, 1.0 in September 2022, 0.7 in March 2023, and most recently 1.0 in June 2023.  We discussed at length the importance of monitoring the PSA moving forward.  He denies any significant urinary symptoms aside from urinary frequency when he drinks large volumes of beer or alcohol.  He consumes 6-7 beers before bed which contributes to his nocturia.  He spontaneously passed a small 3 mm ureteral stone in June 2022, no stone episodes or gross hematuria this year.  RTC with 1 year with [place], MD  [facility] [address] [place], [place] [address] [phone]

## 2022-06-11 ENCOUNTER — Other Ambulatory Visit: Payer: Self-pay | Admitting: Physician Assistant

## 2022-06-11 DIAGNOSIS — I10 Essential (primary) hypertension: Secondary | ICD-10-CM

## 2022-08-06 ENCOUNTER — Encounter: Payer: Self-pay | Admitting: Physician Assistant

## 2022-08-06 ENCOUNTER — Ambulatory Visit (INDEPENDENT_AMBULATORY_CARE_PROVIDER_SITE_OTHER): Payer: Medicare HMO | Admitting: Physician Assistant

## 2022-08-06 VITALS — BP 138/88 | HR 75 | Temp 97.8°F | Resp 16 | Ht 67.0 in | Wt 180.0 lb

## 2022-08-06 DIAGNOSIS — H9191 Unspecified hearing loss, right ear: Secondary | ICD-10-CM

## 2022-08-06 DIAGNOSIS — Z0001 Encounter for general adult medical examination with abnormal findings: Secondary | ICD-10-CM

## 2022-08-06 DIAGNOSIS — F1010 Alcohol abuse, uncomplicated: Secondary | ICD-10-CM

## 2022-08-06 DIAGNOSIS — E538 Deficiency of other specified B group vitamins: Secondary | ICD-10-CM | POA: Diagnosis not present

## 2022-08-06 DIAGNOSIS — I10 Essential (primary) hypertension: Secondary | ICD-10-CM | POA: Diagnosis not present

## 2022-08-06 DIAGNOSIS — R5383 Other fatigue: Secondary | ICD-10-CM

## 2022-08-06 DIAGNOSIS — E782 Mixed hyperlipidemia: Secondary | ICD-10-CM

## 2022-08-06 DIAGNOSIS — R300 Dysuria: Secondary | ICD-10-CM

## 2022-08-06 NOTE — Progress Notes (Signed)
[facility] [place],  [address]  Internal MEDICINE  Office Visit Note  Patient Name: Alexander Cooke  [id number]  [id number]  Date of Service: 08/06/2022  Chief Complaint  Patient presents with   Medicare Wellness   Hypertension   Dizziness    Has been having some vertigo, not consistent, noticed it 6-7 months ago   Quality Metric Gaps    Pneumonia and Shingles Vaccines     HPI Pt is here for routine health maintenance examination -vertigo occasionally, last episode about a month ago. Can last up to a few hours. Very sporadic when this occurs. -often in the morning, but hasn't been able to attribute to anything. Doesn't notice if with position change. -Doesn't always take his amlodipine, did take it this morning.  -BP at home 140/80s therefore unlikely to be secondary to hypotension as it is borderline elevated, especially if he forgets his medication. Discussed med adherence and keeping this consistent -Doing lots of pushups, playing golf, walking on treadmill. He finds he gets bored if not doing something and will try to do some form of exercise -Does continue to drink alcohol. Unsure how much, but will drink heavily at times and may contribute to vertigo. He admits he drinks if he is bored, but denies it being due to anxiety or depression. Discussed alternatives to consider to avoid holding a beer during these times of boredom. He does admit that vertigo may occur after night of drinking when he drinks little water. Discussed increasing water intake -He does also have hearing loss and tinnitus and was seeing ENT, but states the aids didn't help and he needs different ones. He may follow up with them about this and may discuss the vertigo if still present at that time. -He does report getting up several times at night to use restroom. Followed by urology for prostate cancer, however nocturia may also be due to drinking. -Will also update  labs -Declines PNA and shingles vaccines  Current Medication: Outpatient Encounter Medications as of 08/06/2022  Medication Sig   amLODipine (NORVASC) 5 MG tablet TAKE 1 TABLET EVERY DAY   tamsulosin (FLOMAX) 0.4 MG CAPS capsule Take 1 capsule (0.4 mg total) by mouth daily after supper.   No facility-administered encounter medications on file as of 08/06/2022.    Surgical History: Past Surgical History:  Procedure Laterality Date   none      Medical History: Past Medical History:  Diagnosis Date   Arthritis    Hypertension    Kidney stones    Prostate cancer ([place])     Family History: Family History  Problem Relation Age of Onset   Diabetes Sister    Prostate cancer Brother       Review of Systems  Constitutional:  Negative for chills, fatigue and unexpected weight change.  HENT:  Negative for congestion, postnasal drip, rhinorrhea, sneezing and sore throat.   Eyes:  Negative for redness.  Respiratory:  Negative for cough, chest tightness and shortness of breath.   Cardiovascular:  Negative for chest pain and palpitations.  Gastrointestinal:  Negative for abdominal pain, constipation, diarrhea, nausea and vomiting.  Genitourinary:  Negative for dysuria and frequency.  Musculoskeletal:  Negative for arthralgias, back pain, joint swelling and neck pain.  Skin:  Negative for rash.  Neurological:  Positive for light-headedness. Negative for tremors, syncope, numbness and headaches.  Hematological:  Negative for adenopathy. Does not bruise/bleed easily.  Psychiatric/Behavioral:  Positive for sleep disturbance. Negative for behavioral problems (  Depression) and suicidal ideas. The patient is not nervous/anxious.      Vital Signs: BP 138/88 Comment: 148/97  Pulse 75   Temp 97.8 F (36.6 C)   Resp 16   Ht '5\' 7"'$  (1.702 m)   Wt 180 lb (81.6 kg)   SpO2 98%   BMI 28.19 kg/m    Physical Exam Vitals and nursing note reviewed.  Constitutional:      General: He is not  in acute distress.    Appearance: Normal appearance. He is well-developed. He is not diaphoretic.  HENT:     Head: Normocephalic and atraumatic.     Mouth/Throat:     Pharynx: No oropharyngeal exudate.  Eyes:     Pupils: Pupils are equal, round, and reactive to light.  Neck:     Thyroid: No thyromegaly.     Vascular: No JVD.     Trachea: No tracheal deviation.  Cardiovascular:     Rate and Rhythm: Normal rate and regular rhythm.     Heart sounds: Normal heart sounds. No murmur heard.    No friction rub. No gallop.  Pulmonary:     Effort: Pulmonary effort is normal. No respiratory distress.     Breath sounds: No wheezing or rales.  Chest:     Chest wall: No tenderness.  Abdominal:     General: Bowel sounds are normal.     Palpations: Abdomen is soft.     Tenderness: There is no abdominal tenderness.  Musculoskeletal:        General: Normal range of motion.     Cervical back: Normal range of motion and neck supple.     Right lower leg: No edema.     Left lower leg: No edema.  Lymphadenopathy:     Cervical: No cervical adenopathy.  Skin:    General: Skin is warm and dry.  Neurological:     Mental Status: He is alert and oriented to person, place, and time.     Cranial Nerves: No cranial nerve deficit.  Psychiatric:        Behavior: Behavior normal.        Thought Content: Thought content normal.        Judgment: Judgment normal.      LABS: No results found for this or any previous visit (from the past 2160 hour(s)).      Assessment/Plan: 1. Encounter for general adult medical examination with abnormal findings CPE performed, routine labs ordered - CBC w/Diff/Platelet - Comprehensive metabolic panel - Lipid Panel With LDL/HDL Ratio - TSH + free T4  2. Essential hypertension Stable, discussed adhering to medications and taking daily as prescribed  3. Hearing loss of right ear, unspecified hearing loss type May follow up with ENT  4. Alcohol consumption  binge drinking Again discussed dangers of excess alcohol and this may impact vertigo. Discussed alternatives to alcohol when bored and to increase water intake. Will update labs.  5. Mixed hyperlipidemia - Lipid Panel With LDL/HDL Ratio  6. Other fatigue - CBC w/Diff/Platelet - Comprehensive metabolic panel - Lipid Panel With LDL/HDL Ratio - TSH + free T4  7. B12 deficiency - B12 and Folate Panel  8. Dysuria - UA/M w/rflx Culture, Routine   General Counseling: Saagar verbalizes understanding of the findings of todays visit and agrees with plan of treatment. I have discussed any further diagnostic evaluation that may be needed or ordered today. We also reviewed his medications today. he has been encouraged to call the office with  any questions or concerns that should arise related to todays visit.    Counseling:    Orders Placed This Encounter  Procedures   UA/M w/rflx Culture, Routine   CBC w/Diff/Platelet   Comprehensive metabolic panel   Lipid Panel With LDL/HDL Ratio   TSH + free T4   B12 and Folate Panel    No orders of the defined types were placed in this encounter.   This patient was seen by Drema Dallas, PA-C in collaboration with Dr. Clayborn Bigness as a part of collaborative care agreement.  Total time spent:35 Minutes  Time spent includes review of chart, medications, test results, and follow up plan with the patient.     Lavera Guise, MD  Internal Medicine

## 2022-08-07 LAB — UA/M W/RFLX CULTURE, ROUTINE
Bilirubin, UA: NEGATIVE
Glucose, UA: NEGATIVE
Leukocytes,UA: NEGATIVE
Nitrite, UA: NEGATIVE
RBC, UA: NEGATIVE
Specific Gravity, UA: 1.022 (ref 1.005–1.030)
Urobilinogen, Ur: 0.2 mg/dL (ref 0.2–1.0)
pH, UA: 5.5 (ref 5.0–7.5)

## 2022-08-07 LAB — MICROSCOPIC EXAMINATION
Bacteria, UA: NONE SEEN
Casts: NONE SEEN /lpf
WBC, UA: NONE SEEN /hpf (ref 0–5)

## 2022-08-08 DIAGNOSIS — R5383 Other fatigue: Secondary | ICD-10-CM | POA: Diagnosis not present

## 2022-08-08 DIAGNOSIS — E782 Mixed hyperlipidemia: Secondary | ICD-10-CM | POA: Diagnosis not present

## 2022-08-08 DIAGNOSIS — E538 Deficiency of other specified B group vitamins: Secondary | ICD-10-CM | POA: Diagnosis not present

## 2022-08-08 DIAGNOSIS — Z0001 Encounter for general adult medical examination with abnormal findings: Secondary | ICD-10-CM | POA: Diagnosis not present

## 2022-08-09 LAB — COMPREHENSIVE METABOLIC PANEL
ALT: 20 IU/L (ref 0–44)
AST: 26 IU/L (ref 0–40)
Albumin/Globulin Ratio: 1.5 (ref 1.2–2.2)
Albumin: 4.3 g/dL (ref 3.8–4.8)
Alkaline Phosphatase: 105 IU/L (ref 44–121)
BUN/Creatinine Ratio: 12 (ref 10–24)
BUN: 15 mg/dL (ref 8–27)
Bilirubin Total: 0.6 mg/dL (ref 0.0–1.2)
CO2: 18 mmol/L — ABNORMAL LOW (ref 20–29)
Calcium: 9.7 mg/dL (ref 8.6–10.2)
Chloride: 108 mmol/L — ABNORMAL HIGH (ref 96–106)
Creatinine, Ser: 1.21 mg/dL (ref 0.76–1.27)
Globulin, Total: 2.8 g/dL (ref 1.5–4.5)
Glucose: 101 mg/dL — ABNORMAL HIGH (ref 70–99)
Potassium: 4.4 mmol/L (ref 3.5–5.2)
Sodium: 143 mmol/L (ref 134–144)
Total Protein: 7.1 g/dL (ref 6.0–8.5)
eGFR: 64 mL/min/{1.73_m2} (ref >59)

## 2022-08-09 LAB — CBC WITH DIFFERENTIAL/PLATELET
Basophils Absolute: 0 10*3/uL (ref 0.0–0.2)
Basos: 1 %
EOS (ABSOLUTE): 0.1 10*3/uL (ref 0.0–0.4)
Eos: 1 %
Hematocrit: 47.8 % (ref 37.5–51.0)
Hemoglobin: 16.3 g/dL (ref 13.0–17.7)
Immature Grans (Abs): 0 10*3/uL (ref 0.0–0.1)
Immature Granulocytes: 0 %
Lymphocytes Absolute: 1.2 10*3/uL (ref 0.7–3.1)
Lymphs: 23 %
MCH: 31 pg (ref 26.6–33.0)
MCHC: 34.1 g/dL (ref 31.5–35.7)
MCV: 91 fL (ref 79–97)
Monocytes Absolute: 0.5 10*3/uL (ref 0.1–0.9)
Monocytes: 10 %
Neutrophils Absolute: 3.5 10*3/uL (ref 1.4–7.0)
Neutrophils: 65 %
Platelets: 238 10*3/uL (ref 150–450)
RBC: 5.25 x10E6/uL (ref 4.14–5.80)
RDW: 12.9 % (ref 11.6–15.4)
WBC: 5.4 10*3/uL (ref 3.4–10.8)

## 2022-08-09 LAB — LIPID PANEL WITH LDL/HDL RATIO
Cholesterol, Total: 176 mg/dL (ref 100–199)
HDL: 61 mg/dL (ref >39)
LDL Chol Calc (NIH): 91 mg/dL (ref 0–99)
LDL/HDL Ratio: 1.5 ratio (ref 0.0–3.6)
Triglycerides: 137 mg/dL (ref 0–149)
VLDL Cholesterol Cal: 24 mg/dL (ref 5–40)

## 2022-08-09 LAB — TSH+FREE T4
Free T4: 1.37 ng/dL (ref 0.82–1.77)
TSH: 0.789 u[IU]/mL (ref 0.450–4.500)

## 2022-08-09 LAB — B12 AND FOLATE PANEL
Folate: 14.4 ng/mL (ref >3.0)
Vitamin B-12: 751 pg/mL (ref 232–1245)

## 2022-08-17 ENCOUNTER — Telehealth: Payer: Self-pay

## 2022-08-17 NOTE — Telephone Encounter (Signed)
Spoke with patient regarding recent lab results. Informed to increase hydration per Ander Purpura.

## 2022-08-17 NOTE — Telephone Encounter (Signed)
-----   Message from Mylinda Latina, PA-C sent at 08/17/2022  1:39 PM EST ----- Please let him know that his sugar was slightly elevated, otherwise labs looked pretty good. He does need to increase his hydration

## 2022-12-13 ENCOUNTER — Other Ambulatory Visit: Payer: Self-pay | Admitting: *Deleted

## 2022-12-13 DIAGNOSIS — C61 Malignant neoplasm of prostate: Secondary | ICD-10-CM

## 2022-12-20 ENCOUNTER — Inpatient Hospital Stay: Payer: Medicare PPO | Attending: Radiation Oncology

## 2022-12-20 DIAGNOSIS — C61 Malignant neoplasm of prostate: Secondary | ICD-10-CM | POA: Diagnosis present

## 2022-12-20 LAB — PSA: Prostatic Specific Antigen: 0.54 ng/mL (ref 0.00–4.00)

## 2022-12-24 ENCOUNTER — Other Ambulatory Visit: Payer: Medicare PPO

## 2022-12-27 ENCOUNTER — Encounter: Payer: Self-pay | Admitting: Radiation Oncology

## 2022-12-27 ENCOUNTER — Ambulatory Visit
Admission: RE | Admit: 2022-12-27 | Discharge: 2022-12-27 | Disposition: A | Discharge status: Home / Self Care | Payer: Medicare PPO | Source: Ambulatory Visit | Attending: Radiation Oncology | Admitting: Radiation Oncology

## 2022-12-27 VITALS — BP 135/87 | HR 64 | Temp 96.1°F | Wt 186.0 lb

## 2022-12-27 DIAGNOSIS — Z923 Personal history of irradiation: Secondary | ICD-10-CM | POA: Insufficient documentation

## 2022-12-27 DIAGNOSIS — C61 Malignant neoplasm of prostate: Secondary | ICD-10-CM | POA: Diagnosis present

## 2022-12-27 NOTE — Progress Notes (Signed)
Radiation Oncology Follow up Note  Name: Alexander Cooke   Date:   12/27/2022 MRN:  [id number] DOB: 1951-02-22    This 72 y.o. male presents to the clinic today for 3-year follow-up status post IMRT radiation therapy for Gleason 7 adenocarcinoma the prostate presenting with a PSA of 6.4.  REFERRING PROVIDER: Lavera Guise, MD  HPI: Patient is a.  72 year old male now out 3 years having completed image guided IMRT radiation therapy for Gleason 7 adenocarcinoma the prostate.  Seen today in routine follow-up he is doing well specifically denies any increased lower urinary tract symptoms diarrhea or fatigue.  His most recent PSA is 0.5 down from 0.7 a year ago.  COMPLICATIONS OF TREATMENT: none  FOLLOW UP COMPLIANCE: keeps appointments   PHYSICAL EXAM:  BP 135/87   Pulse 64   Temp (!) 96.1 F (35.6 C) (Tympanic)   Wt 186 lb (84.4 kg)   SpO2 100%   BMI 29.13 kg/m  Well-developed well-nourished patient in NAD. HEENT reveals PERLA, EOMI, discs not visualized.  Oral cavity is clear. No oral mucosal lesions are identified. Neck is clear without evidence of cervical or supraclavicular adenopathy. Lungs are clear to A&P. Cardiac examination is essentially unremarkable with regular rate and rhythm without murmur rub or thrill. Abdomen is benign with no organomegaly or masses noted. Motor sensory and DTR levels are equal and symmetric in the upper and lower extremities. Cranial nerves II through XII are grossly intact. Proprioception is intact. No peripheral adenopathy or edema is identified. No motor or sensory levels are noted. Crude visual fields are within normal range.  RADIOLOGY RESULTS: No current films for review  PLAN: Present time patient is doing well under excellent biochemical control of his prostate cancer.  And pleased with his overall progress.  I have asked to see him back in 1 year with a repeat PSA.  Patient knows to call with any concerns.  I would like to take this opportunity  to thank you for allowing me to participate in the care of your patient.Noreene Filbert, MD

## 2023-02-04 ENCOUNTER — Encounter: Payer: Self-pay | Admitting: Physician Assistant

## 2023-02-04 ENCOUNTER — Ambulatory Visit (INDEPENDENT_AMBULATORY_CARE_PROVIDER_SITE_OTHER): Payer: Medicare HMO | Admitting: Physician Assistant

## 2023-02-04 VITALS — BP 138/88 | HR 61 | Temp 97.6°F | Resp 16 | Ht 67.0 in | Wt 183.4 lb

## 2023-02-04 DIAGNOSIS — I10 Essential (primary) hypertension: Secondary | ICD-10-CM

## 2023-02-04 DIAGNOSIS — F1010 Alcohol abuse, uncomplicated: Secondary | ICD-10-CM | POA: Diagnosis not present

## 2023-02-04 NOTE — Progress Notes (Signed)
[facility] [address] [place], [place] [address]  Internal MEDICINE  Office Visit Note  Patient Name: Alexander Cooke  [id number]  [id number]  Date of Service: 02/04/2023  Chief Complaint  Patient presents with   Follow-up   Hypertension    HPI Pt is here for routine follow up -BP at home stable, Bp 140/80 prior to medications -Declines colon cancer screening, states he understands risks but does not want any further screenings for cancer as he has had prostate cancer and doesn't want to "find anything else" -Declines PNA and shingles vaccines -Continues to drink alcohol especially on game days, but states he is hydrating much better now. Has not had any vertigo anymore. Continued to discuss cutting back on alcohol -Golf once per week, and [facility], also does push ups and walking -Does still have some arthritis and numbness in right hand. Wears wrist brace at night. Does not want to have further eval at this time. Discussed calling if interested in ortho/neuro eval -does have a little drainage he clears often with a cough, does still smoke occasionally. He thinks this is related and knows he needs to quit. This is not new, chronic intermittent throat clearing for drainage.  -denies wheezing or SOB  Current Medication: Outpatient Encounter Medications as of 02/04/2023  Medication Sig   amLODipine (NORVASC) 5 MG tablet TAKE 1 TABLET EVERY DAY   [DISCONTINUED] hydrochlorothiazide (HYDRODIURIL) 25 MG tablet    [DISCONTINUED] tamsulosin (FLOMAX) 0.4 MG CAPS capsule Take 1 capsule (0.4 mg total) by mouth daily after supper.   No facility-administered encounter medications on file as of 02/04/2023.    Surgical History: Past Surgical History:  Procedure Laterality Date   none      Medical History: Past Medical History:  Diagnosis Date   Arthritis    Hypertension    Kidney stones    Prostate cancer ([facility])     Family History: Family  History  Problem Relation Age of Onset   Diabetes Sister    Prostate cancer Brother     Social History   Socioeconomic History   Marital status: Married    Spouse name: Not on file   Number of children: Not on file   Years of education: Not on file   Highest education level: Not on file  Occupational History   Not on file  Tobacco Use   Smoking status: Some Days    Types: Cigarettes    Passive exposure: Current   Smokeless tobacco: Never   Tobacco comments:    Smokes around 6 cigarettes weekly  Vaping Use   Vaping Use: Never used  Substance and Sexual Activity   Alcohol use: Yes    Alcohol/week: 1.0 standard drink of alcohol    Types: 1 Cans of beer per week    Comment: every day   Drug use: Yes    Types: Marijuana   Sexual activity: Yes    Birth control/protection: None  Other Topics Concern   Not on file  Social History Narrative   Not on file   Social Determinants of Health   Financial Resource Strain: Not on file  Food Insecurity: Not on file  Transportation Needs: Not on file  Physical Activity: Not on file  Stress: Not on file  Social Connections: Not on file  Intimate Partner Violence: Not on file      Review of Systems  Constitutional:  Negative for chills, fatigue and unexpected weight change.  HENT:  Positive for postnasal drip. Negative for congestion, rhinorrhea, sneezing and sore throat.   Eyes:  Negative for redness.  Respiratory:  Positive for cough. Negative for chest tightness, shortness of breath and wheezing.   Cardiovascular:  Negative for chest pain and palpitations.  Gastrointestinal:  Negative for abdominal pain, constipation, diarrhea, nausea and vomiting.  Genitourinary:  Negative for dysuria and frequency.  Musculoskeletal:  Negative for arthralgias, back pain, joint swelling and neck pain.  Skin:  Negative for rash.  Neurological:  Positive for numbness. Negative for tremors.  Hematological:  Negative for adenopathy. Does not  bruise/bleed easily.  Psychiatric/Behavioral:  Positive for sleep disturbance. Negative for behavioral problems (Depression) and suicidal ideas. The patient is not nervous/anxious.     Vital Signs: BP 138/88 Comment: 135/90  Pulse 61   Temp 97.6 F (36.4 C)   Resp 16   Ht 5\' 7"  (1.702 m)   Wt 183 lb 6.4 oz (83.2 kg)   SpO2 99%   BMI 28.72 kg/m    Physical Exam Vitals and nursing note reviewed.  Constitutional:      General: He is not in acute distress.    Appearance: Normal appearance. He is well-developed. He is not diaphoretic.  HENT:     Head: Normocephalic and atraumatic.     Mouth/Throat:     Pharynx: No oropharyngeal exudate.  Eyes:     Pupils: Pupils are equal, round, and reactive to light.  Neck:     Thyroid: No thyromegaly.     Vascular: No JVD.     Trachea: No tracheal deviation.  Cardiovascular:     Rate and Rhythm: Normal rate and regular rhythm.     Heart sounds: Normal heart sounds. No murmur heard.    No friction rub. No gallop.  Pulmonary:     Effort: Pulmonary effort is normal. No respiratory distress.     Breath sounds: No wheezing or rales.  Chest:     Chest wall: No tenderness.  Abdominal:     General: Bowel sounds are normal.     Palpations: Abdomen is soft.  Musculoskeletal:        General: Normal range of motion.     Cervical back: Normal range of motion and neck supple.     Right lower leg: No edema.     Left lower leg: No edema.  Lymphadenopathy:     Cervical: No cervical adenopathy.  Skin:    General: Skin is warm and dry.  Neurological:     Mental Status: He is alert and oriented to person, place, and time.     Cranial Nerves: No cranial nerve deficit.  Psychiatric:        Behavior: Behavior normal.        Thought Content: Thought content normal.        Judgment: Judgment normal.        Assessment/Plan: 1. Essential hypertension Stable, continue current medication  2. Alcohol consumption binge drinking Continue to  decrease alcohol consumption and increase water intake to stay hydrated   General Counseling: Shalom verbalizes understanding of the findings of todays visit and agrees with plan of treatment. I have discussed any further diagnostic evaluation that may be needed or ordered today. We also reviewed his medications today. he has been encouraged to call the office with any questions or concerns that should arise related to todays visit.    No orders of the defined types were placed in this encounter.   No orders of  the defined types were placed in this encounter.   This patient was seen by Drema Dallas, PA-C in collaboration with Dr. Clayborn Bigness as a part of collaborative care agreement.   Total time spent:30 Minutes Time spent includes review of chart, medications, test results, and follow up plan with the patient.      Dr Lavera Guise Internal medicine

## 2023-03-04 ENCOUNTER — Other Ambulatory Visit: Payer: Medicare HMO

## 2023-03-07 ENCOUNTER — Ambulatory Visit: Payer: Medicare HMO | Admitting: Urology

## 2023-03-07 ENCOUNTER — Encounter: Payer: Self-pay | Admitting: Urology

## 2023-03-07 VITALS — BP 154/97 | HR 65 | Ht 68.0 in | Wt 180.0 lb

## 2023-03-07 DIAGNOSIS — C61 Malignant neoplasm of prostate: Secondary | ICD-10-CM | POA: Diagnosis not present

## 2023-03-07 NOTE — Progress Notes (Signed)
   03/07/2023 1:17 PM   Alexander Cooke [birth year] [id number]  Reason for visit: Follow up prostate cancer, history nephrolithiasis, ED, urinary symptoms  HPI: He is a 72 year old male with elevated PSA of 6.4 and underwent a prostate biopsy on 10/21/2019 showing a 30 g prostate with a PSA density of 0.17, 4/12 cores positive for Gleason score 3+4= 7 disease with max core involvement of 12%.  He deferred ADT but completed external beam radiation with Dr. Aggie Cosier in May 2021.  PSA has been very low since that time, most recently 0.54 in March 2024.  He denies any major changes or problems over the last year.  He has some mild ED but not is not at the point where he is interested in medications.  He occasionally reports some occasional weak stream, but again is not at the point where he would like to start medications for this.  He drinks a fair amount of alcohol, including 6 or 7 beers before bedtime which contribute to his urinary symptoms, frequency, nocturia.  History of a spontaneously passed stone in June 2022, denies any stone episodes or gross hematuria in the last year.  Consider trial of Flomax or Cialis in the future for urinary symptoms and ED respectively if patient interested RTC 1 year PSA prior, if PSA remains low after 5 years consider transitioning to PSA yearly with PCP  Sondra Come, MD  [facility] [address], [address] [place], [place] [address] [phone]

## 2023-08-12 ENCOUNTER — Encounter: Payer: Self-pay | Admitting: Physician Assistant

## 2023-08-12 ENCOUNTER — Ambulatory Visit: Payer: Medicare HMO | Admitting: Physician Assistant

## 2023-08-12 VITALS — BP 130/82 | HR 69 | Temp 97.8°F | Resp 16 | Ht 68.0 in | Wt 181.0 lb

## 2023-08-12 DIAGNOSIS — Z0000 Encounter for general adult medical examination without abnormal findings: Secondary | ICD-10-CM

## 2023-08-12 DIAGNOSIS — I10 Essential (primary) hypertension: Secondary | ICD-10-CM

## 2023-08-12 MED ORDER — AMLODIPINE BESYLATE 5 MG PO TABS: 5.0000 mg | ORAL_TABLET | Freq: Every day | ORAL | 1 refills | Status: DC

## 2023-08-12 NOTE — Progress Notes (Signed)
[facility] [address] [place], [place] [address]  Internal MEDICINE  Office Visit Note  Patient Name: Alexander Cooke  [id number]  [id number]  Date of Service: 08/21/2023  Chief Complaint  Patient presents with   Hypertension   Medicare Wellness    HPI Alexander Cooke presents for an annual well visit Well-appearing 72 y.o. male Routine CRC screening: declines colon screening.  Eye exam: will schedule appt due to some worsening vision in left eye. Still has tinnitus, hearing aids worn only occasionally because doesn't help much.  Labs: will give lab slip - drinking about 2 beers per night, more on weekends sometimes. No more dizzy spells. Avoiding as much liquor. Still advised to continue to cut down on alcohol -exercising regularly, does 2-64miles on stationary bike daily.  80 push ups per day divided up.  -BP at home 130s/80s without meds now, but will keep amlodipine on hand in case rising again. -Still follows with urology for follow up on hx of prostate cancer -Omega XL helping joints     08/12/2023   10:01 AM 08/06/2022   10:09 AM 08/03/2021    9:14 AM  MMSE - Mini Mental State Exam  Orientation to time 5 5 5   Orientation to Place 5 5 5   Registration 3 3 3   Attention/ Calculation 5 5 5   Recall 3 3 3   Language- name 2 objects 2 2 2   Language- repeat 1 1 1   Language- follow 3 step command 3 3 3   Language- read & follow direction 1 1 1   Write a sentence 1 1 1   Copy design 1 1 1   Total score 30 30 30     Functional Status Survey: Is the patient deaf or have difficulty hearing?: No Does the patient have difficulty seeing, even when wearing glasses/contacts?: No Does the patient have difficulty concentrating, remembering, or making decisions?: No Does the patient have difficulty walking or climbing stairs?: No Does the patient have difficulty dressing or bathing?: No Does the patient have difficulty doing errands alone such as visiting a doctor's office or  shopping?: No     12/20/2021   10:33 AM 02/01/2022    9:46 AM 08/06/2022   10:07 AM 02/04/2023    9:41 AM 08/12/2023   10:02 AM  Fall Risk  Falls in the past year?  0 1 0 0  Was there an injury with Fall?   1    Fall Risk Category Calculator   2    Fall Risk Category (Retired)   Moderate    (RETIRED) Patient Fall Risk Level Low fall risk      Patient at Risk for Falls Due to     No Fall Risks  Fall risk Follow up     Falls evaluation completed       08/12/2023   10:02 AM  Depression screen PHQ 2/9  Decreased Interest 0  Down, Depressed, Hopeless 0  PHQ - 2 Score 0     Current Medication: Outpatient Encounter Medications as of 08/12/2023  Medication Sig   [DISCONTINUED] amLODipine (NORVASC) 5 MG tablet TAKE 1 TABLET EVERY DAY   amLODipine (NORVASC) 5 MG tablet Take 1 tablet (5 mg total) by mouth daily.   No facility-administered encounter medications on file as of 08/12/2023.    Surgical History: Past Surgical History:  Procedure Laterality Date   none      Medical History: Past Medical History:  Diagnosis Date   Arthritis    Hypertension    Kidney  stones    Prostate cancer (HCC)     Family History: Family History  Problem Relation Age of Onset   Diabetes Sister    Prostate cancer Brother     Social History   Socioeconomic History   Marital status: Married    Spouse name: Not on file   Number of children: Not on file   Years of education: Not on file   Highest education level: Not on file  Occupational History   Not on file  Tobacco Use   Smoking status: Some Days    Types: Cigarettes    Passive exposure: Current   Smokeless tobacco: Never   Tobacco comments:    Smokes around 6 cigarettes weekly  Vaping Use   Vaping status: Never Used  Substance and Sexual Activity   Alcohol use: Yes    Alcohol/week: 1.0 standard drink of alcohol    Types: 1 Cans of beer per week    Comment: every day   Drug use: Yes    Types: Marijuana   Sexual activity:  Yes    Birth control/protection: None  Other Topics Concern   Not on file  Social History Narrative   Not on file   Social Determinants of Health   Financial Resource Strain: Not on file  Food Insecurity: Not on file  Transportation Needs: Not on file  Physical Activity: Not on file  Stress: Not on file  Social Connections: Not on file  Intimate Partner Violence: Not on file      Review of Systems  Constitutional:  Negative for chills, fatigue and unexpected weight change.  HENT:  Negative for congestion, postnasal drip, rhinorrhea, sneezing and sore throat.   Eyes:  Negative for redness.  Respiratory:  Negative for cough, chest tightness and shortness of breath.   Cardiovascular:  Negative for chest pain and palpitations.  Gastrointestinal:  Negative for abdominal pain, constipation, diarrhea, nausea and vomiting.  Genitourinary:  Negative for dysuria and frequency.  Musculoskeletal:  Positive for arthralgias. Negative for back pain, joint swelling and neck pain.  Skin:  Negative for rash.  Neurological: Negative.  Negative for tremors and numbness.  Hematological:  Negative for adenopathy. Does not bruise/bleed easily.  Psychiatric/Behavioral:  Negative for behavioral problems (Depression), sleep disturbance and suicidal ideas. The patient is not nervous/anxious.     Vital Signs: BP 130/82   Pulse 69   Temp 97.8 F (36.6 C)   Resp 16   Ht 5\' 8"  (1.727 m)   Wt 181 lb (82.1 kg)   SpO2 98%   BMI 27.52 kg/m    Physical Exam Vitals and nursing note reviewed.  Constitutional:      Appearance: Normal appearance.  HENT:     Head: Normocephalic and atraumatic.  Cardiovascular:     Rate and Rhythm: Normal rate and regular rhythm.  Pulmonary:     Effort: Pulmonary effort is normal.     Breath sounds: Normal breath sounds.  Neurological:     Mental Status: He is alert.  Psychiatric:        Behavior: Behavior normal.        Assessment/Plan: 1. Encounter for  Medicare annual wellness exam AWV performed, declines colon screening. Labs ordered  2. Essential hypertension BP stable off meds recently, but would like refill on hand in case rising again - amLODipine (NORVASC) 5 MG tablet; Take 1 tablet (5 mg total) by mouth daily.  Dispense: 90 tablet; Refill: 1     General Counseling: Jorah verbalizes  understanding of the findings of todays visit and agrees with plan of treatment. I have discussed any further diagnostic evaluation that may be needed or ordered today. We also reviewed his medications today. he has been encouraged to call the office with any questions or concerns that should arise related to todays visit.    No orders of the defined types were placed in this encounter.   Meds ordered this encounter  Medications   amLODipine (NORVASC) 5 MG tablet    Sig: Take 1 tablet (5 mg total) by mouth daily.    Dispense:  90 tablet    Refill:  1    Return in about 6 months (around 02/09/2024) for general follow up.   Total time spent:35 Minutes Time spent includes review of chart, medications, test results, and follow up plan with the patient.   [place] Controlled Substance Database was reviewed by me.  This patient was seen by Lynn Ito, PA-C in collaboration with Dr. Beverely Risen as a part of collaborative care agreement.  Lynn Ito, PA-C Internal medicine

## 2023-08-20 ENCOUNTER — Other Ambulatory Visit: Payer: Self-pay | Admitting: Physician Assistant

## 2023-08-20 DIAGNOSIS — R6889 Other general symptoms and signs: Secondary | ICD-10-CM | POA: Diagnosis not present

## 2023-08-20 DIAGNOSIS — E038 Other specified hypothyroidism: Secondary | ICD-10-CM | POA: Diagnosis not present

## 2023-08-20 DIAGNOSIS — E782 Mixed hyperlipidemia: Secondary | ICD-10-CM | POA: Diagnosis not present

## 2023-08-20 DIAGNOSIS — R5383 Other fatigue: Secondary | ICD-10-CM | POA: Diagnosis not present

## 2023-08-20 DIAGNOSIS — Z0001 Encounter for general adult medical examination with abnormal findings: Secondary | ICD-10-CM | POA: Diagnosis not present

## 2023-08-20 DIAGNOSIS — F1010 Alcohol abuse, uncomplicated: Secondary | ICD-10-CM | POA: Diagnosis not present

## 2023-08-22 LAB — COMPREHENSIVE METABOLIC PANEL
ALT: 12 [IU]/L (ref 0–44)
AST: 18 [IU]/L (ref 0–40)
Albumin: 4.3 g/dL (ref 3.8–4.8)
Alkaline Phosphatase: 110 [IU]/L (ref 44–121)
BUN/Creatinine Ratio: 17 (ref 10–24)
BUN: 22 mg/dL (ref 8–27)
Bilirubin Total: 0.4 mg/dL (ref 0.0–1.2)
CO2: 21 mmol/L (ref 20–29)
Calcium: 10 mg/dL (ref 8.6–10.2)
Chloride: 106 mmol/L (ref 96–106)
Creatinine, Ser: 1.28 mg/dL — ABNORMAL HIGH (ref 0.76–1.27)
Globulin, Total: 2.9 g/dL (ref 1.5–4.5)
Glucose: 76 mg/dL (ref 70–99)
Potassium: 4.6 mmol/L (ref 3.5–5.2)
Sodium: 142 mmol/L (ref 134–144)
Total Protein: 7.2 g/dL (ref 6.0–8.5)
eGFR: 59 mL/min/{1.73_m2} — ABNORMAL LOW (ref >59)

## 2023-08-22 LAB — CBC WITH DIFFERENTIAL/PLATELET
Basophils Absolute: 0 10*3/uL (ref 0.0–0.2)
Basos: 1 %
EOS (ABSOLUTE): 0.1 10*3/uL (ref 0.0–0.4)
Eos: 1 %
Hematocrit: 47.7 % (ref 37.5–51.0)
Hemoglobin: 16.5 g/dL (ref 13.0–17.7)
Immature Grans (Abs): 0 10*3/uL (ref 0.0–0.1)
Immature Granulocytes: 0 %
Lymphocytes Absolute: 1.1 10*3/uL (ref 0.7–3.1)
Lymphs: 20 %
MCH: 32.5 pg (ref 26.6–33.0)
MCHC: 34.6 g/dL (ref 31.5–35.7)
MCV: 94 fL (ref 79–97)
Monocytes Absolute: 0.5 10*3/uL (ref 0.1–0.9)
Monocytes: 9 %
Neutrophils Absolute: 3.8 10*3/uL (ref 1.4–7.0)
Neutrophils: 69 %
Platelets: 215 10*3/uL (ref 150–450)
RBC: 5.08 x10E6/uL (ref 4.14–5.80)
RDW: 12.8 % (ref 11.6–15.4)
WBC: 5.6 10*3/uL (ref 3.4–10.8)

## 2023-08-22 LAB — LIPID PANEL WITH LDL/HDL RATIO
Cholesterol, Total: 167 mg/dL (ref 100–199)
HDL: 66 mg/dL (ref >39)
LDL Chol Calc (NIH): 69 mg/dL (ref 0–99)
LDL/HDL Ratio: 1 ratio (ref 0.0–3.6)
Triglycerides: 195 mg/dL — ABNORMAL HIGH (ref 0–149)
VLDL Cholesterol Cal: 32 mg/dL (ref 5–40)

## 2023-08-22 LAB — TSH: TSH: 0.785 u[IU]/mL (ref 0.450–4.500)

## 2023-08-22 LAB — T4, FREE: Free T4: 1.1 ng/dL (ref 0.82–1.77)

## 2023-08-22 LAB — VITAMIN B1: Thiamine: 151.6 nmol/L (ref 66.5–200.0)

## 2023-08-23 ENCOUNTER — Other Ambulatory Visit: Payer: Self-pay

## 2023-08-23 DIAGNOSIS — N289 Disorder of kidney and ureter, unspecified: Secondary | ICD-10-CM

## 2023-08-23 NOTE — Progress Notes (Signed)
Per Leotis Shames, adding BMP for patient to get done.

## 2023-12-23 ENCOUNTER — Inpatient Hospital Stay: Payer: Medicare PPO | Attending: Radiation Oncology

## 2023-12-23 ENCOUNTER — Other Ambulatory Visit: Payer: Self-pay

## 2023-12-23 DIAGNOSIS — C61 Malignant neoplasm of prostate: Secondary | ICD-10-CM

## 2023-12-23 LAB — PSA: Prostatic Specific Antigen: 0.51 ng/mL (ref 0.00–4.00)

## 2023-12-30 ENCOUNTER — Ambulatory Visit
Admission: RE | Admit: 2023-12-30 | Discharge: 2023-12-30 | Disposition: A | Discharge status: Home / Self Care | Payer: Medicare PPO | Source: Ambulatory Visit | Attending: Radiation Oncology | Admitting: Radiation Oncology

## 2023-12-30 ENCOUNTER — Encounter: Payer: Self-pay | Admitting: Radiation Oncology

## 2023-12-30 VITALS — BP 139/92 | HR 66 | Temp 97.8°F | Resp 16 | Ht 67.0 in | Wt 181.4 lb

## 2023-12-30 DIAGNOSIS — Z923 Personal history of irradiation: Secondary | ICD-10-CM | POA: Diagnosis not present

## 2023-12-30 DIAGNOSIS — C61 Malignant neoplasm of prostate: Secondary | ICD-10-CM | POA: Diagnosis not present

## 2023-12-30 DIAGNOSIS — Z191 Hormone sensitive malignancy status: Secondary | ICD-10-CM | POA: Diagnosis not present

## 2023-12-30 NOTE — Progress Notes (Signed)
 Radiation Oncology Follow up Note  Name: Alexander Cooke   Date:   12/30/2023 MRN:  [id number] DOB: Oct 16, 1950    This 73 y.o. male presents to the clinic today for 74-month follow-up status post IMRT radiation therapy for Gleason 7 adenocarcinoma the prostate presenting with a PSA of 6.4  REFERRING PROVIDER: Carlean Jews, PA*  HPI: Patient is a 73 year old male now out for years having completed radiation therapy to his prostate for Gleason 7 adenocarcinoma presented with a PSA of 6.4.  Seen today in routine follow-up he is doing well.  Specifically denies any increased lower urinary tract symptoms diarrhea or fatigue.  His most recent PSA is stable at 0.5 continues a downward trend over time..  COMPLICATIONS OF TREATMENT: none  FOLLOW UP COMPLIANCE: keeps appointments   PHYSICAL EXAM:  BP (!) 139/92   Pulse 66   Temp 97.8 F (36.6 C) (Tympanic)   Resp 16   Ht 5\' 7"  (1.702 m)   Wt 181 lb 6.4 oz (82.3 kg)   BMI 28.41 kg/m  Well-developed well-nourished patient in NAD. HEENT reveals PERLA, EOMI, discs not visualized.  Oral cavity is clear. No oral mucosal lesions are identified. Neck is clear without evidence of cervical or supraclavicular adenopathy. Lungs are clear to A&P. Cardiac examination is essentially unremarkable with regular rate and rhythm without murmur rub or thrill. Abdomen is benign with no organomegaly or masses noted. Motor sensory and DTR levels are equal and symmetric in the upper and lower extremities. Cranial nerves II through XII are grossly intact. Proprioception is intact. No peripheral adenopathy or edema is identified. No motor or sensory levels are noted. Crude visual fields are within normal range.  RADIOLOGY RESULTS: No current films for review  PLAN: Present time patient is in excellent biochemical remission of his prostate cancer.  He is seeing urology and I am going to turn follow-up care after 4 years over to them.  I be happy to reevaluate the  patient anytime should that be indicated.  Patient knows to call with any concerns.  I would like to take this opportunity to thank you for allowing me to participate in the care of your patient.Carmina Miller, MD

## 2024-02-10 ENCOUNTER — Ambulatory Visit (INDEPENDENT_AMBULATORY_CARE_PROVIDER_SITE_OTHER): Payer: Medicare HMO | Admitting: Physician Assistant

## 2024-02-10 ENCOUNTER — Encounter: Payer: Self-pay | Admitting: Physician Assistant

## 2024-02-10 ENCOUNTER — Telehealth: Payer: Self-pay | Admitting: Physician Assistant

## 2024-02-10 VITALS — BP 120/84 | HR 75 | Temp 97.8°F | Resp 16 | Ht 69.0 in | Wt 179.0 lb

## 2024-02-10 DIAGNOSIS — G8929 Other chronic pain: Secondary | ICD-10-CM | POA: Diagnosis not present

## 2024-02-10 DIAGNOSIS — M79641 Pain in right hand: Secondary | ICD-10-CM

## 2024-02-10 DIAGNOSIS — I10 Essential (primary) hypertension: Secondary | ICD-10-CM

## 2024-02-10 DIAGNOSIS — G4700 Insomnia, unspecified: Secondary | ICD-10-CM

## 2024-02-10 MED ORDER — AMLODIPINE BESYLATE 5 MG PO TABS: 5.0000 mg | ORAL_TABLET | Freq: Every day | ORAL | 1 refills | Status: DC

## 2024-02-10 MED ORDER — TRAZODONE HCL 50 MG PO TABS: 50.0000 mg | ORAL_TABLET | Freq: Every day | ORAL | 1 refills | Status: AC

## 2024-02-10 NOTE — Telephone Encounter (Signed)
Orthopedic referral sent via [facility] to [facility]. Notified patient. Gave pt telephone # [phone]

## 2024-02-10 NOTE — Progress Notes (Signed)
 [facility] [address] [place], [place] [address]  Internal MEDICINE  Office Visit Note  Patient Name: Alexander Cooke  [id number]  [id number]  Date of Service: 02/10/2024  Chief Complaint  Patient presents with   Follow-up   Hand Pain    Right    Hypertension    HPI Pt is here for routine follow up -Right hand aches, arthritis present for years. Wearing wrist splint and helps some. Can get numbness in all fingers and reduced ROM in thumb. Sleeping on back does best, but can't stay in this position -BP stable, needs amlodipine  refill -callus on right foot and will try soaking -wife has dementia, this is rough on him, but he is managing. He does take 1 day of a week to play golf. -Sometimes has a hard time sleeping between wife and hand pain. This is when he will resort to using alcohol. Explained this is worse for sleep. He is open to trying a sleep aid instead.  Current Medication: Outpatient Encounter Medications as of 02/10/2024  Medication Sig   traZODone (DESYREL) 50 MG tablet Take 1 tablet (50 mg total) by mouth at bedtime.   [DISCONTINUED] amLODipine  (NORVASC ) 5 MG tablet Take 1 tablet (5 mg total) by mouth daily.   amLODipine  (NORVASC ) 5 MG tablet Take 1 tablet (5 mg total) by mouth daily.   No facility-administered encounter medications on file as of 02/10/2024.    Surgical History: Past Surgical History:  Procedure Laterality Date   none      Medical History: Past Medical History:  Diagnosis Date   Arthritis    Hypertension    Kidney stones    Prostate cancer (HCC)     Family History: Family History  Problem Relation Age of Onset   Diabetes Sister    Prostate cancer Brother     Social History   Socioeconomic History   Marital status: Married    Spouse name: Not on file   Number of children: Not on file   Years of education: Not on file   Highest education level: Not on file  Occupational History   Not on file  Tobacco Use    Smoking status: Some Days    Types: Cigarettes    Passive exposure: Current   Smokeless tobacco: Never   Tobacco comments:    Smokes around 6 cigarettes weekly doing for 20 years  Vaping Use   Vaping status: Never Used  Substance and Sexual Activity   Alcohol use: Yes    Alcohol/week: 1.0 standard drink of alcohol    Types: 1 Cans of beer per week    Comment: every day   Drug use: Yes    Types: Marijuana   Sexual activity: Yes    Birth control/protection: None  Other Topics Concern   Not on file  Social History Narrative   Not on file   Social Drivers of Health   Financial Resource Strain: Not on file  Food Insecurity: Not on file  Transportation Needs: Not on file  Physical Activity: Not on file  Stress: Not on file  Social Connections: Not on file  Intimate Partner Violence: Not on file      Review of Systems  Constitutional:  Negative for chills, fatigue and unexpected weight change.  HENT:  Negative for congestion, postnasal drip, rhinorrhea, sneezing and sore throat.   Eyes:  Negative for redness.  Respiratory:  Negative for cough, chest tightness and shortness of breath.   Cardiovascular:  Negative for  chest pain and palpitations.  Gastrointestinal:  Negative for abdominal pain, constipation, diarrhea, nausea and vomiting.  Genitourinary:  Negative for dysuria and frequency.  Musculoskeletal:  Positive for arthralgias. Negative for back pain, joint swelling and neck pain.  Skin:  Negative for rash.       Callus right foot  Neurological: Negative.  Negative for tremors and numbness.  Hematological:  Negative for adenopathy. Does not bruise/bleed easily.  Psychiatric/Behavioral:  Positive for sleep disturbance. Negative for behavioral problems (Depression) and suicidal ideas. The patient is not nervous/anxious.     Vital Signs: BP 120/84   Pulse 75   Temp 97.8 F (36.6 C)   Resp 16   Ht 5\' 9"  (1.753 m)   Wt 179 lb (81.2 kg)   SpO2 97%   BMI 26.43 kg/m     Physical Exam Vitals and nursing note reviewed.  Constitutional:      General: He is not in acute distress.    Appearance: Normal appearance. He is well-developed. He is not diaphoretic.  HENT:     Head: Normocephalic and atraumatic.  Neck:     Thyroid : No thyromegaly.     Vascular: No JVD.     Trachea: No tracheal deviation.  Cardiovascular:     Rate and Rhythm: Normal rate and regular rhythm.     Heart sounds: Normal heart sounds. No murmur heard.    No friction rub. No gallop.  Pulmonary:     Effort: Pulmonary effort is normal. No respiratory distress.     Breath sounds: No wheezing or rales.  Chest:     Chest wall: No tenderness.  Musculoskeletal:        General: Normal range of motion.     Cervical back: Normal range of motion and neck supple.     Right lower leg: No edema.     Left lower leg: No edema.  Lymphadenopathy:     Cervical: No cervical adenopathy.  Skin:    General: Skin is warm and dry.     Comments: Callus on bottom of right foot  Neurological:     General: No focal deficit present.     Mental Status: He is alert.  Psychiatric:        Behavior: Behavior normal.        Thought Content: Thought content normal.        Judgment: Judgment normal.        Assessment/Plan: 1. Essential hypertension (Primary) Stable, continue current medication - amLODipine  (NORVASC ) 5 MG tablet; Take 1 tablet (5 mg total) by mouth daily.  Dispense: 90 tablet; Refill: 1  2. Chronic pain of right hand Will refer to ortho due to pain and numbness in all fingers at times. Wearing wrist splint already - AMB referral to orthopedics  3. Insomnia, unspecified type May start with 1/2-1 tab trazodone at night to help sleep. Call if any problems - traZODone (DESYREL) 50 MG tablet; Take 1 tablet (50 mg total) by mouth at bedtime.  Dispense: 90 tablet; Refill: 1   General Counseling: Noach verbalizes understanding of the findings of todays visit and agrees with plan of  treatment. I have discussed any further diagnostic evaluation that may be needed or ordered today. We also reviewed his medications today. he has been encouraged to call the office with any questions or concerns that should arise related to todays visit.    Orders Placed This Encounter  Procedures   AMB referral to orthopedics    Meds ordered this  encounter  Medications   amLODipine  (NORVASC ) 5 MG tablet    Sig: Take 1 tablet (5 mg total) by mouth daily.    Dispense:  90 tablet    Refill:  1   traZODone (DESYREL) 50 MG tablet    Sig: Take 1 tablet (50 mg total) by mouth at bedtime.    Dispense:  90 tablet    Refill:  1    This patient was seen by Taylor Favia, PA-C in collaboration with Dr. Verneta Gone as a part of collaborative care agreement.   Total time spent:30 Minutes Time spent includes review of chart, medications, test results, and follow up plan with the patient.      Dr Fozia M Khan Internal medicine

## 2024-02-11 DIAGNOSIS — G5603 Carpal tunnel syndrome, bilateral upper limbs: Secondary | ICD-10-CM | POA: Diagnosis not present

## 2024-02-11 DIAGNOSIS — R2231 Localized swelling, mass and lump, right upper limb: Secondary | ICD-10-CM | POA: Diagnosis not present

## 2024-02-12 ENCOUNTER — Telehealth: Payer: Self-pay | Admitting: Physician Assistant

## 2024-02-12 NOTE — Telephone Encounter (Signed)
 Orthopedic appointment 02/20/2024 @ [facility]

## 2024-03-04 ENCOUNTER — Other Ambulatory Visit: Payer: Self-pay

## 2024-03-05 ENCOUNTER — Ambulatory Visit (INDEPENDENT_AMBULATORY_CARE_PROVIDER_SITE_OTHER): Payer: Self-pay | Admitting: Urology

## 2024-03-05 VITALS — BP 152/88 | HR 76 | Ht 68.0 in | Wt 178.1 lb

## 2024-03-05 DIAGNOSIS — C61 Malignant neoplasm of prostate: Secondary | ICD-10-CM | POA: Diagnosis not present

## 2024-03-05 DIAGNOSIS — N200 Calculus of kidney: Secondary | ICD-10-CM | POA: Diagnosis not present

## 2024-03-05 LAB — BLADDER SCAN AMB NON-IMAGING: Scan Result: 38

## 2024-03-05 NOTE — Progress Notes (Signed)
   03/05/2024 1:18 PM   Alexander Cooke 01-Dec-1950 [id number]  Reason for visit: Follow up prostate cancer, history nephrolithiasis, ED, urinary symptoms  HPI: He is a 73 year old male who presented with elevated PSA of 6.4 and underwent a prostate biopsy on 10/21/2019 showing a 30 g prostate with a PSA density of 0.17, 4/12 cores positive for Gleason score 3+4= 7 disease with max core involvement of 12%.  He deferred ADT but completed external beam radiation with Dr. Maida Sciara in May 2021.  PSA has been very low since that time, most recently 0.51 March 2025 and stable from prior.  He denies any major changes or problems over the last year.  He has some mild ED but not is not at the point where he is interested in medications.  He occasionally reports some occasional weak stream, but again is not at the point where he would like to start medications for this.  He drinks a fair amount of alcohol, including 6 or 7 beers before bedtime which contribute to his urinary symptoms, frequency, nocturia 3-4 times overnight.  PVR today is normal at 38ml.  We discussed avoiding bladder irritants and voiding prior to bedtime.  History of a spontaneously passed stone in June 2022, denies any stone episodes or gross hematuria in the last year.  Consider trial of Flomax  or Cialis in the future for urinary symptoms and ED respectively if patient interested RTC 1 year PSA prior, if PSA remains low after 5 years consider transitioning to PSA yearly with PCP  Lawerence Pressman, MD  [facility] [address] [place], [place] [address] [phone]

## 2024-03-05 NOTE — Patient Instructions (Signed)

## 2024-03-26 DIAGNOSIS — G5623 Lesion of ulnar nerve, bilateral upper limbs: Secondary | ICD-10-CM | POA: Diagnosis not present

## 2024-03-26 DIAGNOSIS — G5603 Carpal tunnel syndrome, bilateral upper limbs: Secondary | ICD-10-CM | POA: Diagnosis not present

## 2024-03-31 DIAGNOSIS — G5623 Lesion of ulnar nerve, bilateral upper limbs: Secondary | ICD-10-CM | POA: Diagnosis not present

## 2024-03-31 DIAGNOSIS — G5603 Carpal tunnel syndrome, bilateral upper limbs: Secondary | ICD-10-CM | POA: Diagnosis not present

## 2024-04-21 DIAGNOSIS — M5412 Radiculopathy, cervical region: Secondary | ICD-10-CM | POA: Diagnosis not present

## 2024-04-21 DIAGNOSIS — G5601 Carpal tunnel syndrome, right upper limb: Secondary | ICD-10-CM | POA: Diagnosis not present

## 2024-04-21 DIAGNOSIS — G5622 Lesion of ulnar nerve, left upper limb: Secondary | ICD-10-CM | POA: Diagnosis not present

## 2024-04-21 DIAGNOSIS — G5603 Carpal tunnel syndrome, bilateral upper limbs: Secondary | ICD-10-CM | POA: Diagnosis not present

## 2024-06-17 DIAGNOSIS — I10 Essential (primary) hypertension: Secondary | ICD-10-CM | POA: Diagnosis not present

## 2024-06-17 DIAGNOSIS — Z1211 Encounter for screening for malignant neoplasm of colon: Secondary | ICD-10-CM | POA: Diagnosis not present

## 2024-08-03 ENCOUNTER — Other Ambulatory Visit: Payer: Self-pay | Admitting: Physician Assistant

## 2024-08-03 DIAGNOSIS — I10 Essential (primary) hypertension: Secondary | ICD-10-CM

## 2024-08-17 ENCOUNTER — Ambulatory Visit (INDEPENDENT_AMBULATORY_CARE_PROVIDER_SITE_OTHER): Payer: Medicare HMO | Admitting: Physician Assistant

## 2024-08-17 ENCOUNTER — Encounter: Payer: Self-pay | Admitting: Physician Assistant

## 2024-08-17 VITALS — BP 130/82 | HR 76 | Temp 97.9°F | Resp 16 | Ht 68.0 in | Wt 170.0 lb

## 2024-08-17 DIAGNOSIS — R5383 Other fatigue: Secondary | ICD-10-CM

## 2024-08-17 DIAGNOSIS — M79641 Pain in right hand: Secondary | ICD-10-CM

## 2024-08-17 DIAGNOSIS — E782 Mixed hyperlipidemia: Secondary | ICD-10-CM

## 2024-08-17 DIAGNOSIS — R300 Dysuria: Secondary | ICD-10-CM | POA: Diagnosis not present

## 2024-08-17 DIAGNOSIS — Z1329 Encounter for screening for other suspected endocrine disorder: Secondary | ICD-10-CM

## 2024-08-17 DIAGNOSIS — Z0100 Encounter for examination of eyes and vision without abnormal findings: Secondary | ICD-10-CM

## 2024-08-17 DIAGNOSIS — G8929 Other chronic pain: Secondary | ICD-10-CM

## 2024-08-17 DIAGNOSIS — I10 Essential (primary) hypertension: Secondary | ICD-10-CM

## 2024-08-17 DIAGNOSIS — Z0001 Encounter for general adult medical examination with abnormal findings: Secondary | ICD-10-CM

## 2024-08-17 NOTE — Progress Notes (Signed)
 [facility] [address] [place], [place] [address]  Internal MEDICINE  Office Visit Note  Patient Name: Alexander Cooke  [id number]  [id number]  Date of Service: 08/17/2024  Chief Complaint  Patient presents with   Medicare Wellness    HPI Alexander Cooke presents for an annual well visit Well-appearing 73 y.o. male Routine CRC screening: Insurance provider did FOBT in Sept Eye exam: Needs eye exam, states hard time with seeing things up close. No issues with distance. 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 Pushes himself to workout everyday and enjoys this. No bulges. -Also busy caring for wife with dementia, exercise can be a stress reliever for him     08/17/2024    8:59 AM 08/12/2023   10:01 AM 08/06/2022   10:09 AM  MMSE - Mini Mental State Exam  Orientation to time 5 5 5   Orientation to Place 5 5 5   Registration 3 3 3   Attention/ Calculation 5 5 5   Recall 3 3 3   Language- name 2 objects 2 2 2   Language- repeat 1 1 1   Language- follow 3 step command 3 3 3   Language- read & follow direction 1 1 1   Write a sentence 1 1 1   Copy design 1 1 1   Total score 30 30 30     Functional Status Survey: Is the patient deaf or have difficulty hearing?: Yes Does the patient have difficulty seeing, even when wearing glasses/contacts?: Yes Does the patient have difficulty concentrating, remembering, or making decisions?: No Does the patient have difficulty walking or climbing stairs?: No Does the patient have difficulty dressing or bathing?: No Does the  patient have difficulty doing errands alone such as visiting a doctor's office or shopping?: No     08/06/2022   10:07 AM 02/04/2023    9:41 AM 08/12/2023   10:02 AM 02/10/2024    9:36 AM 08/17/2024    8:58 AM  Fall Risk  Falls in the past year? 1 0 0 0 0  Was there an injury with Fall? 1      Fall Risk Category Calculator 2      Fall Risk Category (Retired) Moderate       Patient at Risk for Falls Due to   No Fall Risks No Fall Risks   Fall risk Follow up   Falls evaluation completed Falls evaluation completed Falls evaluation completed     Data saved with a previous flowsheet row definition       08/17/2024    8:58 AM  Depression screen PHQ 2/9  Decreased Interest 0  Down, Depressed, Hopeless 0  PHQ - 2 Score 0        No data to display            Current Medication: Outpatient Encounter Medications as of 08/17/2024  Medication Sig   amLODipine  (NORVASC ) 5 MG tablet TAKE 1 TABLET EVERY DAY   traZODone  (DESYREL ) 50 MG tablet Take 1 tablet (50 mg total) by mouth at bedtime.   No facility-administered encounter medications on file as of  08/17/2024.    Surgical History: Past Surgical History:  Procedure Laterality Date   none      Medical History: Past Medical History:  Diagnosis Date   Arthritis    Hypertension    Kidney stones    Prostate cancer (HCC)     Family History: Family History  Problem Relation Age of Onset   Diabetes Sister    Prostate cancer Brother     Social History   Socioeconomic History   Marital status: Married    Spouse name: Not on file   Number of children: Not on file   Years of education: Not on file   Highest education level: Not on file  Occupational History   Not on file  Tobacco Use   Smoking status: Some Days    Types: Cigarettes    Passive exposure: Current   Smokeless tobacco: Never   Tobacco comments:    Smokes around 2 cigarettes weekly doing for 20 years  Vaping Use   Vaping status: Never Used   Substance and Sexual Activity   Alcohol use: Yes    Alcohol/week: 1.0 standard drink of alcohol    Types: 1 Cans of beer per week    Comment: every day   Drug use: Yes    Types: Marijuana   Sexual activity: Yes    Birth control/protection: None  Other Topics Concern   Not on file  Social History Narrative   Not on file   Social Drivers of Health   Financial Resource Strain: Not on file  Food Insecurity: Not on file  Transportation Needs: Not on file  Physical Activity: Not on file  Stress: Not on file  Social Connections: Not on file  Intimate Partner Violence: Not on file      Review of Systems  Constitutional:  Negative for chills, fatigue and unexpected weight change.  HENT:  Positive for tinnitus. Negative for congestion, postnasal drip, rhinorrhea, sneezing and sore throat.   Eyes:  Positive for visual disturbance. Negative for redness.  Respiratory:  Negative for cough, chest tightness and shortness of breath.   Cardiovascular:  Negative for chest pain and palpitations.  Gastrointestinal:  Negative for abdominal pain, constipation, diarrhea, nausea and vomiting.  Genitourinary:  Negative for dysuria and frequency.  Musculoskeletal:  Positive for arthralgias and myalgias. Negative for back pain, joint swelling and neck pain.  Skin:  Negative for rash.  Neurological: Negative.  Negative for tremors and numbness.  Hematological:  Negative for adenopathy. Does not bruise/bleed easily.  Psychiatric/Behavioral:  Negative for behavioral problems (Depression), sleep disturbance and suicidal ideas. The patient is not nervous/anxious.     Vital Signs: BP 130/82   Pulse 76   Temp 97.9 F (36.6 C)   Resp 16   Ht 5' 8 (1.727 m)   Wt 170 lb (77.1 kg)   SpO2 99%   BMI 25.85 kg/m    Physical Exam Vitals and nursing note reviewed.  Constitutional:      Appearance: Normal appearance.  HENT:     Head: Normocephalic and atraumatic.  Eyes:     Extraocular Movements:  Extraocular movements intact.  Cardiovascular:     Rate and Rhythm: Normal rate and regular rhythm.  Pulmonary:     Effort: Pulmonary effort is normal.     Breath sounds: Normal breath sounds.  Abdominal:     Palpations: There is no mass.     Tenderness: There is no abdominal tenderness.     Hernia: No hernia is  present.  Skin:    General: Skin is warm.  Neurological:     Mental Status: He is alert and oriented to person, place, and time.  Psychiatric:        Behavior: Behavior normal.        Assessment/Plan: 1. Encounter for Medicare annual examination with abnormal findings (Primary) AWV performed, labs ordered, UTD on FOBT  2. Essential hypertension Well controlled, continue amlodipine   3. Mixed hyperlipidemia - Lipid Panel With LDL/HDL Ratio  4. Thyroid  disorder screen - TSH + free T4  5. Routine eye exam - Ambulatory referral to Ophthalmology  6. Other fatigue - CBC w/Diff/Platelet - Comprehensive metabolic panel with GFR - TSH + free T4 - Lipid Panel With LDL/HDL Ratio  7. Dysuria - UA/M w/rflx Culture, Routine  8. Chronic pain of right hand Was supposed to have carpal tunnel release, however pt cancelled and is monitoring symptoms for now    General Counseling: Alexander Cooke verbalizes understanding of the findings of todays visit and agrees with plan of treatment. I have discussed any further diagnostic evaluation that may be needed or ordered today. We also reviewed his medications today. he has been encouraged to call the office with any questions or concerns that should arise related to todays visit.    Orders Placed This Encounter  Procedures   CBC w/Diff/Platelet   Comprehensive metabolic panel with GFR   TSH + free T4   Lipid Panel With LDL/HDL Ratio   UA/M w/rflx Culture, Routine   Ambulatory referral to Ophthalmology    No orders of the defined types were placed in this encounter.   Return in about 6 months (around 02/14/2025) for general  follow up.   Total time spent:35 Minutes Time spent includes review of chart, medications, test results, and follow up plan with the patient.    Controlled Substance Database was reviewed by me.  This patient was seen by Tinnie Pro, PA-C in collaboration with Dr. Sigrid Bathe as a part of collaborative care agreement.  Tinnie Pro, PA-C Internal medicine

## 2024-08-18 DIAGNOSIS — R5383 Other fatigue: Secondary | ICD-10-CM | POA: Diagnosis not present

## 2024-08-18 DIAGNOSIS — Z1329 Encounter for screening for other suspected endocrine disorder: Secondary | ICD-10-CM | POA: Diagnosis not present

## 2024-08-18 DIAGNOSIS — E782 Mixed hyperlipidemia: Secondary | ICD-10-CM | POA: Diagnosis not present

## 2024-08-19 LAB — CBC WITH DIFFERENTIAL/PLATELET
Basophils Absolute: 0 x10E3/uL (ref 0.0–0.2)
Basos: 1 %
EOS (ABSOLUTE): 0.1 x10E3/uL (ref 0.0–0.4)
Eos: 1 %
Hematocrit: 47 % (ref 37.5–51.0)
Hemoglobin: 15.7 g/dL (ref 13.0–17.7)
Immature Grans (Abs): 0 x10E3/uL (ref 0.0–0.1)
Immature Granulocytes: 0 %
Lymphocytes Absolute: 1.4 x10E3/uL (ref 0.7–3.1)
Lymphs: 18 %
MCH: 32.1 pg (ref 26.6–33.0)
MCHC: 33.4 g/dL (ref 31.5–35.7)
MCV: 96 fL (ref 79–97)
Monocytes Absolute: 0.6 x10E3/uL (ref 0.1–0.9)
Monocytes: 8 %
Neutrophils Absolute: 5.6 x10E3/uL (ref 1.4–7.0)
Neutrophils: 72 %
Platelets: 209 x10E3/uL (ref 150–450)
RBC: 4.89 x10E6/uL (ref 4.14–5.80)
RDW: 13.5 % (ref 11.6–15.4)
WBC: 7.7 x10E3/uL (ref 3.4–10.8)

## 2024-08-19 LAB — UA/M W/RFLX CULTURE, ROUTINE
Bilirubin, UA: NEGATIVE
Glucose, UA: NEGATIVE
Nitrite, UA: NEGATIVE
RBC, UA: NEGATIVE
Specific Gravity, UA: 1.021 (ref 1.005–1.030)
Urobilinogen, Ur: 0.2 mg/dL (ref 0.2–1.0)
pH, UA: 5 (ref 5.0–7.5)

## 2024-08-19 LAB — LIPID PANEL WITH LDL/HDL RATIO
Cholesterol, Total: 171 mg/dL (ref 100–199)
HDL: 80 mg/dL (ref >39)
LDL Chol Calc (NIH): 73 mg/dL (ref 0–99)
LDL/HDL Ratio: 0.9 ratio (ref 0.0–3.6)
Triglycerides: 100 mg/dL (ref 0–149)
VLDL Cholesterol Cal: 18 mg/dL (ref 5–40)

## 2024-08-19 LAB — COMPREHENSIVE METABOLIC PANEL WITH GFR
ALT: 16 IU/L (ref 0–44)
AST: 21 IU/L (ref 0–40)
Albumin: 4.4 g/dL (ref 3.8–4.8)
Alkaline Phosphatase: 113 IU/L (ref 47–123)
BUN/Creatinine Ratio: 9 — ABNORMAL LOW (ref 10–24)
BUN: 11 mg/dL (ref 8–27)
Bilirubin Total: 0.7 mg/dL (ref 0.0–1.2)
CO2: 19 mmol/L — ABNORMAL LOW (ref 20–29)
Calcium: 9.8 mg/dL (ref 8.6–10.2)
Chloride: 106 mmol/L (ref 96–106)
Creatinine, Ser: 1.23 mg/dL (ref 0.76–1.27)
Globulin, Total: 2.8 g/dL (ref 1.5–4.5)
Glucose: 91 mg/dL (ref 70–99)
Potassium: 4.1 mmol/L (ref 3.5–5.2)
Sodium: 141 mmol/L (ref 134–144)
Total Protein: 7.2 g/dL (ref 6.0–8.5)
eGFR: 62 mL/min/1.73 (ref >59)

## 2024-08-19 LAB — TSH+FREE T4
Free T4: 1.2 ng/dL (ref 0.82–1.77)
TSH: 0.698 u[IU]/mL (ref 0.450–4.500)

## 2024-08-19 LAB — MICROSCOPIC EXAMINATION
Bacteria, UA: NONE SEEN
Casts: NONE SEEN /LPF
RBC, Urine: NONE SEEN /HPF (ref 0–2)
WBC, UA: NONE SEEN /HPF (ref 0–5)

## 2024-08-19 LAB — URINE CULTURE, REFLEX

## 2024-08-20 ENCOUNTER — Telehealth: Payer: Self-pay | Admitting: Physician Assistant

## 2024-08-20 NOTE — Telephone Encounter (Signed)
 Ophthalmology referral sent via [facility]. Notified patient. Gave patient telephone # [phone]

## 2024-08-21 ENCOUNTER — Telehealth: Payer: Self-pay | Admitting: Physician Assistant

## 2024-08-21 NOTE — Telephone Encounter (Signed)
 Ophthalmology appointment 09/03/24 with [facility]

## 2024-08-25 ENCOUNTER — Ambulatory Visit: Payer: Self-pay | Admitting: Physician Assistant

## 2024-08-26 NOTE — Telephone Encounter (Signed)
Spoke with patient regarding labs.

## 2024-08-26 NOTE — Telephone Encounter (Signed)
-----   Message from Tinnie MARLA Pro sent at 08/25/2024  2:12 PM EST ----- Please let him know that his labs overall look good ----- Message ----- From: Interface, [facility] Lab Results In Sent: 08/18/2024   7:37 AM EST To: Tinnie MARLA Pro, PA-C

## 2024-08-31 NOTE — Progress Notes (Signed)
 Ashraf Mesta                                          MRN: [id number]   08/31/2024   The [facility] Quality Team Specialist reviewed this patient medical record for the purposes of chart review for care gap closure. The following were reviewed: abstraction for care gap closure-controlling blood pressure.    [facility] Quality Team

## 2024-09-03 DIAGNOSIS — H25013 Cortical age-related cataract, bilateral: Secondary | ICD-10-CM | POA: Diagnosis not present

## 2024-09-03 DIAGNOSIS — H2513 Age-related nuclear cataract, bilateral: Secondary | ICD-10-CM | POA: Diagnosis not present

## 2024-09-03 DIAGNOSIS — H02401 Unspecified ptosis of right eyelid: Secondary | ICD-10-CM | POA: Diagnosis not present

## 2024-09-04 DIAGNOSIS — H524 Presbyopia: Secondary | ICD-10-CM | POA: Diagnosis not present

## 2024-10-12 ENCOUNTER — Ambulatory Visit (INDEPENDENT_AMBULATORY_CARE_PROVIDER_SITE_OTHER): Admitting: Physician Assistant

## 2024-10-12 ENCOUNTER — Encounter: Payer: Self-pay | Admitting: Physician Assistant

## 2024-10-12 VITALS — BP 115/78 | HR 73 | Temp 98.0°F | Resp 16 | Ht 68.0 in | Wt 163.6 lb

## 2024-10-12 DIAGNOSIS — R1909 Other intra-abdominal and pelvic swelling, mass and lump: Secondary | ICD-10-CM | POA: Diagnosis not present

## 2024-10-12 DIAGNOSIS — R1032 Left lower quadrant pain: Secondary | ICD-10-CM

## 2024-10-12 NOTE — Progress Notes (Signed)
 " [facility] [address] [place], [place] [address]  Internal MEDICINE  Office Visit Note  Patient Name: Alexander Cooke  [id number]  [id number]  Date of Service: 10/12/2024  Chief Complaint  Patient presents with   Acute Visit    Possible hernia near groin area x1.5 weeks     HPI Pt is here for a sick visit. -Possibly left inguinal hernia, first felt some lower left side aches back in Nov, but not really bothersome until last few weeks.  -sometimes painful, can pop out when bending and lifting. Will go back on its own. Helps with lifting wife and transferring her. -BM normal, no blood in stool.   Current Medication:  Outpatient Encounter Medications as of 10/12/2024  Medication Sig   amLODipine  (NORVASC ) 5 MG tablet TAKE 1 TABLET EVERY DAY   traZODone  (DESYREL ) 50 MG tablet Take 1 tablet (50 mg total) by mouth at bedtime.   No facility-administered encounter medications on file as of 10/12/2024.      Medical History: Past Medical History:  Diagnosis Date   Arthritis    Hypertension    Kidney stones    Prostate cancer (HCC)      Vital Signs: BP 115/78   Pulse 73   Temp 98 F (36.7 C)   Resp 16   Ht 5' 8 (1.727 m)   Wt 163 lb 9.6 oz (74.2 kg)   SpO2 98%   BMI 24.88 kg/m    Review of Systems  Constitutional:  Negative for fatigue and fever.  HENT:  Negative for congestion, mouth sores and postnasal drip.   Respiratory:  Negative for cough.   Cardiovascular:  Negative for chest pain.  Gastrointestinal:  Negative for blood in stool.  Genitourinary:  Negative for flank pain.       Left groin pain  Psychiatric/Behavioral: Negative.      Physical Exam Vitals and nursing note reviewed.  Constitutional:      Appearance: Normal appearance.  HENT:     Head: Normocephalic and atraumatic.  Eyes:     Extraocular Movements: Extraocular movements intact.  Cardiovascular:     Rate and Rhythm: Normal rate and regular rhythm.  Pulmonary:      Effort: Pulmonary effort is normal.     Breath sounds: Normal breath sounds.  Abdominal:     Tenderness: There is abdominal tenderness.     Hernia: A hernia is present.     Comments: Left inguinal bulge with coughing, tenderness  Neurological:     Mental Status: He is alert and oriented to person, place, and time.  Psychiatric:        Behavior: Behavior normal.       Assessment/Plan: 1. Left inguinal pain (Primary) Will order CT to evaluate for possible inguinal hernia. Will need referral to [facility] pending results. Call or go to ED if sudden worsening or new symptoms arise. - CT ABDOMEN PELVIS WO CONTRAST; Future  2. Inguinal bulge - CT ABDOMEN PELVIS WO CONTRAST; Future   General Counseling: Alexander Cooke verbalizes understanding of the findings of todays visit and agrees with plan of treatment. I have discussed any further diagnostic evaluation that may be needed or ordered today. We also reviewed his medications today. he has been encouraged to call the office with any questions or concerns that should arise related to todays visit.    Counseling:    No orders of the defined types were placed in this encounter.   No orders of the defined types were  placed in this encounter.   Time spent:30 Minutes "

## 2024-10-13 ENCOUNTER — Telehealth: Payer: Self-pay | Admitting: Physician Assistant

## 2024-10-13 NOTE — Telephone Encounter (Signed)
 Notified patient of CT appointment date, arrival time, location and\-Toni

## 2024-10-20 ENCOUNTER — Ambulatory Visit
Admission: RE | Admit: 2024-10-20 | Discharge: 2024-10-20 | Disposition: A | Discharge status: Home / Self Care | Source: Ambulatory Visit | Attending: Physician Assistant | Admitting: Physician Assistant

## 2024-10-20 DIAGNOSIS — R1032 Left lower quadrant pain: Secondary | ICD-10-CM | POA: Insufficient documentation

## 2024-10-20 DIAGNOSIS — R1909 Other intra-abdominal and pelvic swelling, mass and lump: Secondary | ICD-10-CM | POA: Insufficient documentation

## 2024-11-04 ENCOUNTER — Ambulatory Visit: Payer: Self-pay | Admitting: Physician Assistant

## 2024-11-06 ENCOUNTER — Other Ambulatory Visit: Payer: Self-pay | Admitting: Physician Assistant

## 2024-11-06 DIAGNOSIS — N2889 Other specified disorders of kidney and ureter: Secondary | ICD-10-CM

## 2024-11-06 NOTE — Progress Notes (Signed)
 Order placed

## 2025-02-15 ENCOUNTER — Ambulatory Visit: Admitting: Physician Assistant

## 2025-03-01 ENCOUNTER — Other Ambulatory Visit

## 2025-03-05 ENCOUNTER — Ambulatory Visit: Admitting: Physician Assistant

## 2025-08-19 ENCOUNTER — Ambulatory Visit: Admitting: Physician Assistant
